# Patient Record
Sex: Male | Born: 1965 | Race: White | Hispanic: No | Marital: Married | Smoking: Never smoker
Health system: Southern US, Community
[De-identification: ages and names within clinical notes are randomized; demographics above are authoritative.]

## PROBLEM LIST (undated history)

## (undated) DIAGNOSIS — E785 Hyperlipidemia, unspecified: Secondary | ICD-10-CM

## (undated) DIAGNOSIS — I10 Essential (primary) hypertension: Secondary | ICD-10-CM

## (undated) DIAGNOSIS — E7800 Pure hypercholesterolemia, unspecified: Secondary | ICD-10-CM

## (undated) DIAGNOSIS — Z87442 Personal history of urinary calculi: Secondary | ICD-10-CM

## (undated) DIAGNOSIS — K429 Umbilical hernia without obstruction or gangrene: Secondary | ICD-10-CM

## (undated) HISTORY — PX: HERNIA REPAIR: SHX51

## (undated) HISTORY — PX: UMBILICAL HERNIA REPAIR: SHX196

---

## 2005-04-05 ENCOUNTER — Emergency Department: Payer: Self-pay | Admitting: Emergency Medicine

## 2005-09-30 ENCOUNTER — Ambulatory Visit: Payer: Self-pay | Admitting: Urology

## 2005-12-15 ENCOUNTER — Ambulatory Visit: Payer: Self-pay | Admitting: Unknown Physician Specialty

## 2009-07-05 ENCOUNTER — Emergency Department: Payer: Self-pay | Admitting: Emergency Medicine

## 2009-07-09 ENCOUNTER — Ambulatory Visit: Payer: Self-pay | Admitting: Internal Medicine

## 2010-07-17 ENCOUNTER — Emergency Department: Payer: Self-pay | Admitting: Emergency Medicine

## 2011-06-18 ENCOUNTER — Ambulatory Visit: Payer: Self-pay | Admitting: Surgery

## 2013-01-15 ENCOUNTER — Ambulatory Visit: Payer: Self-pay | Admitting: Family Medicine

## 2013-03-30 ENCOUNTER — Emergency Department: Payer: Self-pay | Admitting: Unknown Physician Specialty

## 2013-03-30 LAB — COMPREHENSIVE METABOLIC PANEL
Albumin: 4 g/dL (ref 3.4–5.0)
Anion Gap: 8 (ref 7–16)
BUN: 15 mg/dL (ref 7–18)
Bilirubin,Total: 0.3 mg/dL (ref 0.2–1.0)
Calcium, Total: 9 mg/dL (ref 8.5–10.1)
Chloride: 109 mmol/L — ABNORMAL HIGH (ref 98–107)
Co2: 24 mmol/L (ref 21–32)
Glucose: 83 mg/dL (ref 65–99)
Osmolality: 281 (ref 275–301)
Potassium: 3.9 mmol/L (ref 3.5–5.1)
SGOT(AST): 34 U/L (ref 15–37)
SGPT (ALT): 48 U/L (ref 12–78)
Total Protein: 7.2 g/dL (ref 6.4–8.2)

## 2013-03-30 LAB — CBC
HCT: 46.5 % (ref 40.0–52.0)
HGB: 16.2 g/dL (ref 13.0–18.0)
MCH: 29.5 pg (ref 26.0–34.0)
MCHC: 34.9 g/dL (ref 32.0–36.0)
MCV: 85 fL (ref 80–100)
Platelet: 243 10*3/uL (ref 150–440)

## 2013-09-24 IMAGING — CR DG CHEST 2V
1 series · 2 of 2 positions shown · non-contrast
Comparison: none

REASON FOR EXAM: r/o tb/pneumonia   cough
COMMENTS:

PROCEDURE:     MDR - MDR CHEST PA(OR AP) AND LATERAL  - January 15, 2013 [DATE]
RESULT:     Comparison: 07/05/2009

[Series 1: pa · 0.17mm/px · 2 of 2 slices shown]
[im 1/2]
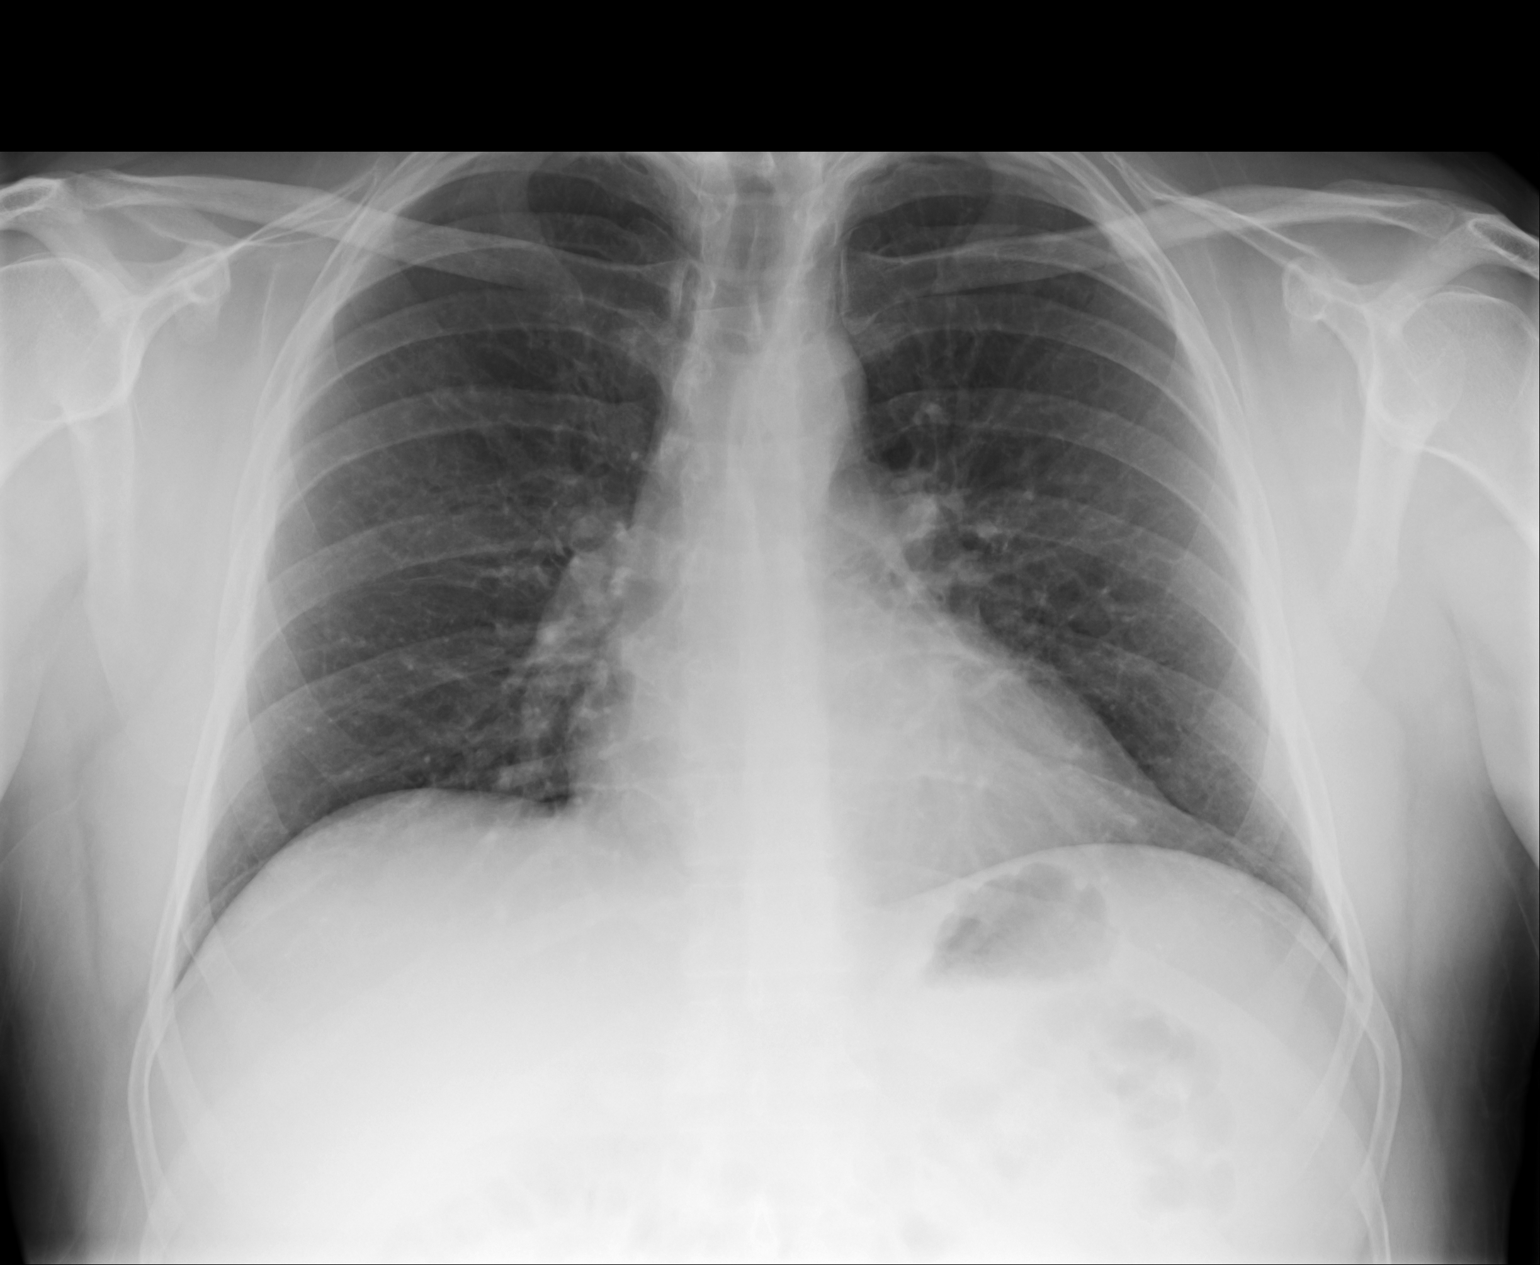
[im 2/2]
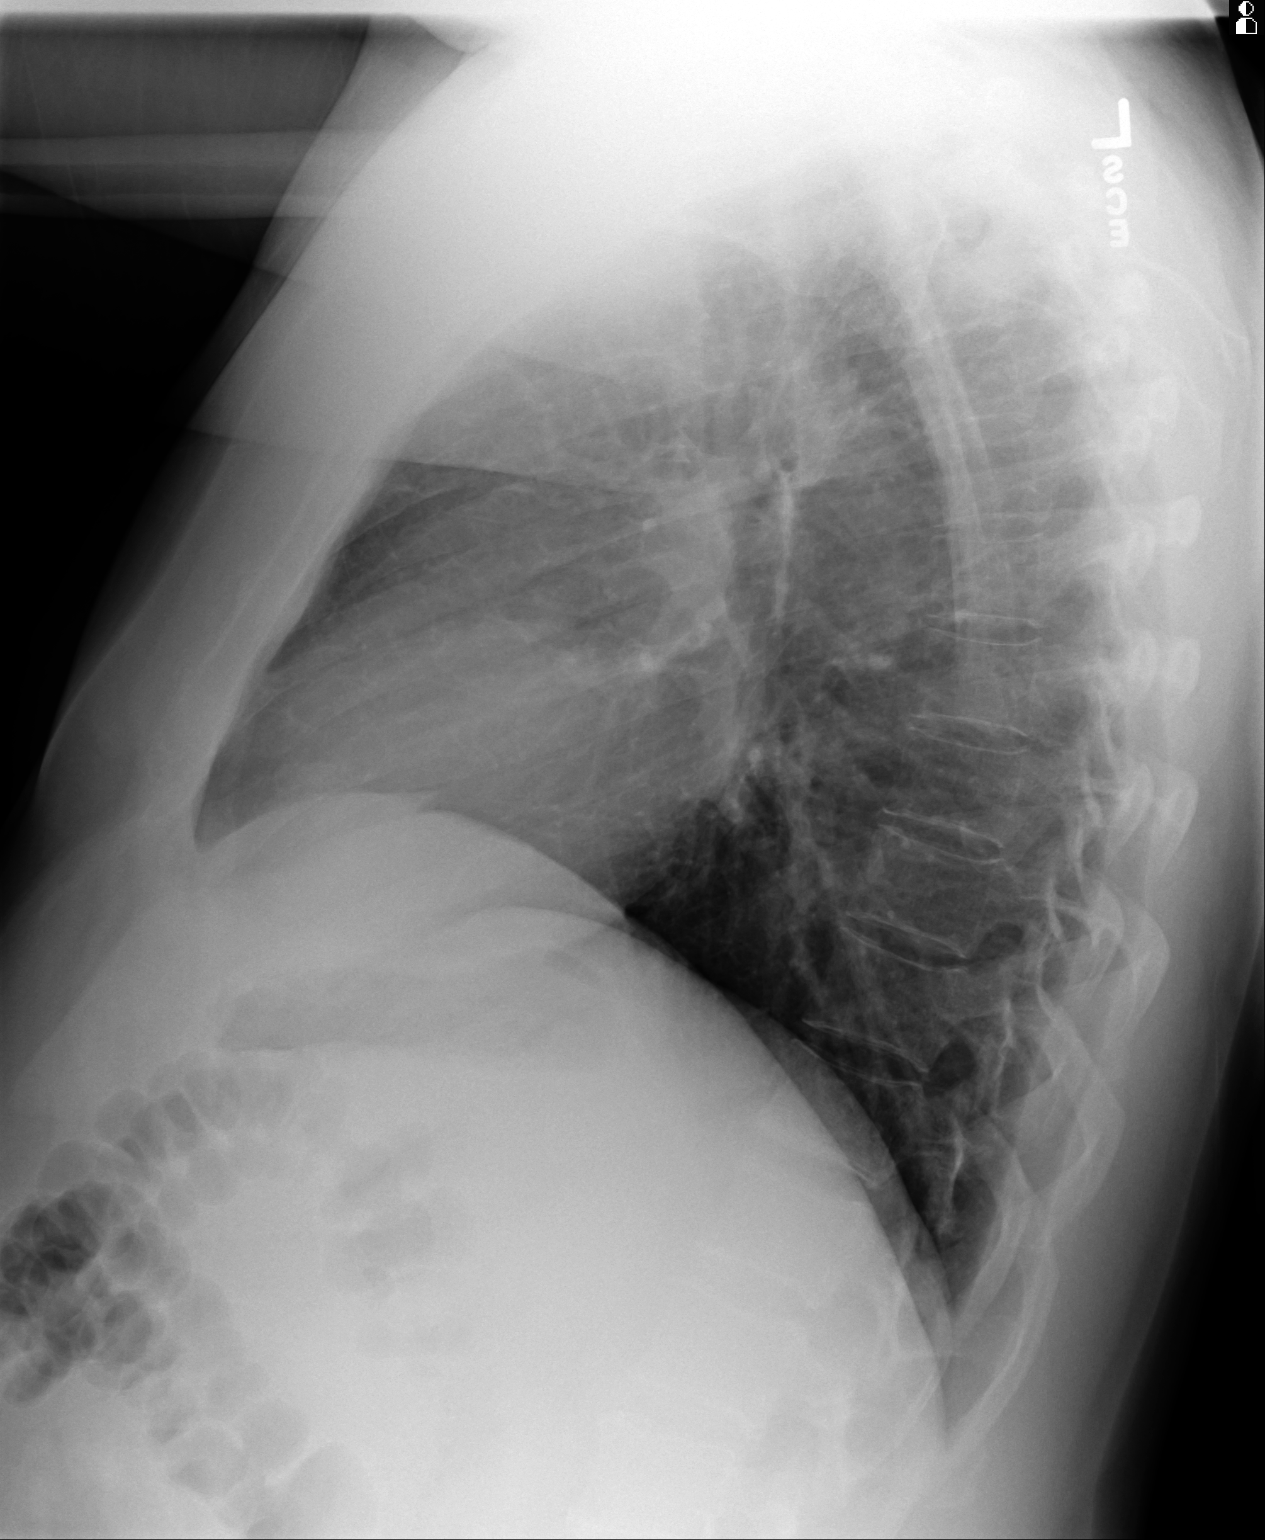

[2 of 2 positions shown; findings below may reference images not displayed]

FINDINGS: The heart and mediastinum are stable. The lung volumes are slightly
diminished. Mild biapical pleural-parenchymal thickening is similar to
prior. No focal pulmonary opacities.
IMPRESSION: No acute cardiopulmonary disease.

[REDACTED]

## 2016-01-11 ENCOUNTER — Emergency Department
Admission: EM | Admit: 2016-01-11 | Discharge: 2016-01-11 | Disposition: A | Discharge status: Home / Self Care | Payer: BC Managed Care – PPO | Attending: Emergency Medicine | Admitting: Emergency Medicine

## 2016-01-11 ENCOUNTER — Encounter: Payer: Self-pay | Admitting: Emergency Medicine

## 2016-01-11 DIAGNOSIS — L509 Urticaria, unspecified: Secondary | ICD-10-CM | POA: Insufficient documentation

## 2016-01-11 DIAGNOSIS — R21 Rash and other nonspecific skin eruption: Secondary | ICD-10-CM | POA: Diagnosis present

## 2016-01-11 HISTORY — DX: Pure hypercholesterolemia, unspecified: E78.00

## 2016-01-11 MED ORDER — PREDNISONE 50 MG PO TABS: 50.0000 mg | ORAL_TABLET | Freq: Every day | ORAL | Status: DC

## 2016-01-11 NOTE — ED Notes (Signed)
Patient continues to be in no acute distress.

## 2016-01-11 NOTE — Discharge Instructions (Signed)
Hives Hives are itchy, red, swollen areas of the skin. They can vary in size and location on your body. Hives can come and go for hours or several days (acute hives) or for several weeks (chronic hives). Hives do not spread from person to person (noncontagious). They may get worse with scratching, exercise, and emotional stress. CAUSES   Allergic reaction to food, additives, or drugs.  Infections, including the common cold.  Illness, such as vasculitis, lupus, or thyroid disease.  Exposure to sunlight, heat, or cold.  Exercise.  Stress.  Contact with chemicals. SYMPTOMS   Red or white swollen patches on the skin. The patches may change size, shape, and location quickly and repeatedly.  Itching.  Swelling of the hands, feet, and face. This may occur if hives develop deeper in the skin. DIAGNOSIS  Your caregiver can usually tell what is wrong by performing a physical exam. Skin or blood tests may also be done to determine the cause of your hives. In some cases, the cause cannot be determined. TREATMENT  Mild cases usually get better with medicines such as antihistamines. Severe cases may require an emergency epinephrine injection. If the cause of your hives is known, treatment includes avoiding that trigger.  HOME CARE INSTRUCTIONS   Avoid causes that trigger your hives.  Take antihistamines as directed by your caregiver to reduce the severity of your hives. Non-sedating or low-sedating antihistamines are usually recommended. Do not drive while taking an antihistamine.  Take any other medicines prescribed for itching as directed by your caregiver.  Wear loose-fitting clothing.  Keep all follow-up appointments as directed by your caregiver. SEEK MEDICAL CARE IF:   You have persistent or severe itching that is not relieved with medicine.  You have painful or swollen joints. SEEK IMMEDIATE MEDICAL CARE IF:   You have a fever.  Your tongue or lips are swollen.  You have  trouble breathing or swallowing.  You feel tightness in the throat or chest.  You have abdominal pain. These problems may be the first sign of a life-threatening allergic reaction. Call your local emergency services (911 in [place] MAKE SURE YOU:   Understand these instructions.  Will watch your condition.  Will get help right away if you are not doing well or get worse.   This information is not intended to replace advice given to you by your health care provider. Make sure you discuss any questions you have with your health care provider.   Document Released: 11/08/2005 Document Revised: 11/13/2013 Document Reviewed: 02/01/2012 Elsevier Interactive Patient Education 2016 [employer]  

## 2016-01-11 NOTE — ED Notes (Signed)
Pt states he started having a rash yesterday on his R side. Pt states that he began taking Benadryl and since then the rash has moved to his hands and arms. Pt states she feels like his throat is tight. Pt breathing with no difficulty at this time, no acute distress noted at this time. Pt able to swallow and maintain secretions at this time.

## 2016-01-11 NOTE — ED Notes (Signed)
Discussed discharge instructions, prescriptions, and follow-up care with patient. No questions or concerns at this time. Pt stable at discharge.  

## 2016-01-11 NOTE — ED Provider Notes (Signed)
[facility] Emergency Department Provider Note  ____________________________________________  Time seen: On arrival  I have reviewed the triage vital signs and the nursing notes.   HISTORY  Chief Complaint Rash and Allergic Reaction    HPI Joel Delacruz is a 50 y.o. male who presents with a rash. Patient reports he developed an itchy rash that appears to be mobile yesterday. It does improve somewhat with Benadryl. He denies shortness of breath to me. He says his throat feels fine. He has never had hives before.    Past Medical History  Diagnosis Date  . High cholesterol     There are no active problems to display for this patient.   Past Surgical History  Procedure Laterality Date  . Hernia repair      Current Outpatient Rx  Name  Route  Sig  Dispense  Refill  . predniSONE (DELTASONE) 50 MG tablet   Oral   Take 1 tablet (50 mg total) by mouth daily with breakfast.   5 tablet   0     Allergies Review of patient's allergies indicates no known allergies.  History reviewed. No pertinent family history.  Social History Social History  Substance Use Topics  . Smoking status: Never Smoker   . Smokeless tobacco: None  . Alcohol Use: Yes     Comment: Occassionally    Review of Systems  Constitutional: Negative for fever. Eyes: Negative for visual changes. ENT: Negative for sore throat   Genitourinary: Negative for dysuria. Musculoskeletal: Negative for back pain. Skin: Negative for rash. Neurological: Negative for headaches or focal weakness   ____________________________________________   PHYSICAL EXAM:  VITAL SIGNS: ED Triage Vitals  Enc Vitals Group     BP 01/11/16 1335 165/106 mmHg     Pulse Rate 01/11/16 1335 90     Resp 01/11/16 1335 18     Temp 01/11/16 1335 98 F (36.7 C)     Temp Source 01/11/16 1335 Oral     SpO2 01/11/16 1335 95 %     Weight 01/11/16 1335 220 lb (99.791 kg)     Height 01/11/16 1335   (1.727 m)     Head Cir --      Peak Flow --      Pain Score --      Pain Loc --      Pain Edu? --      Excl. in GC? --      Constitutional: Alert and oriented. Well appearing and in no distress. Eyes: Conjunctivae are normal.  ENT   Head: Normocephalic and atraumatic.   Mouth/Throat: Mucous membranes are moist. Cardiovascular: Normal rate, regular rhythm.  Respiratory: Normal respiratory effort without tachypnea nor retractions.  Gastrointestinal: Soft and non-tender in all quadrants. No distention. There is no CVA tenderness. Musculoskeletal: Nontender with normal range of motion in all extremities. Neurologic:  Normal speech and language. No gross focal neurologic deficits are appreciated. Skin:  Skin is warm, dry and intact. Rash consistent with urticaria on the trunk and arms Psychiatric: Mood and affect are normal. Patient exhibits appropriate insight and judgment.  ____________________________________________    LABS (pertinent positives/negatives)  Labs Reviewed - No data to display  ____________________________________________     ____________________________________________    RADIOLOGY I have personally reviewed any xrays that were ordered on this patient: None  ____________________________________________   PROCEDURES  Procedure(s) performed: none   ____________________________________________   INITIAL IMPRESSION / ASSESSMENT AND PLAN / ED COURSE  Pertinent labs &  imaging results that were available during my care of the patient were reviewed by me and considered in my medical decision making (see chart for details).  Patient has been taking Benadryl which has been helping but the rash has returned. We will start steroids for suspected urticaria. No intraoral swelling or pharyngeal swelling. No shortness of breath. No stridor. Patient is well-appearing and comfortable. Return precautions  discussed  ____________________________________________   FINAL CLINICAL IMPRESSION(S) / ED DIAGNOSES  Final diagnoses:  Hives     Jene Every, MD 01/11/16 2239

## 2020-07-30 ENCOUNTER — Other Ambulatory Visit: Payer: Self-pay

## 2020-07-30 ENCOUNTER — Other Ambulatory Visit: Payer: BC Managed Care – PPO

## 2020-07-30 DIAGNOSIS — Z20822 Contact with and (suspected) exposure to covid-19: Secondary | ICD-10-CM

## 2020-08-01 LAB — SARS-COV-2, NAA 2 DAY TAT

## 2020-08-01 LAB — NOVEL CORONAVIRUS, NAA: SARS-CoV-2, NAA: NOT DETECTED

## 2020-08-04 ENCOUNTER — Other Ambulatory Visit: Payer: Self-pay | Admitting: Critical Care Medicine

## 2020-08-04 ENCOUNTER — Other Ambulatory Visit: Payer: BC Managed Care – PPO

## 2020-08-04 DIAGNOSIS — Z20822 Contact with and (suspected) exposure to covid-19: Secondary | ICD-10-CM

## 2020-08-05 LAB — NOVEL CORONAVIRUS, NAA: SARS-CoV-2, NAA: NOT DETECTED

## 2020-08-05 LAB — SARS-COV-2, NAA 2 DAY TAT

## 2020-08-06 ENCOUNTER — Other Ambulatory Visit: Payer: BC Managed Care – PPO

## 2020-08-13 ENCOUNTER — Other Ambulatory Visit: Payer: BC Managed Care – PPO

## 2020-08-13 DIAGNOSIS — Z20822 Contact with and (suspected) exposure to covid-19: Secondary | ICD-10-CM

## 2020-08-16 LAB — NOVEL CORONAVIRUS, NAA: SARS-CoV-2, NAA: NOT DETECTED

## 2020-08-18 ENCOUNTER — Other Ambulatory Visit: Payer: Self-pay

## 2020-08-18 ENCOUNTER — Other Ambulatory Visit: Payer: Self-pay | Admitting: Specialist

## 2020-08-18 ENCOUNTER — Ambulatory Visit
Admission: RE | Admit: 2020-08-18 | Discharge: 2020-08-18 | Disposition: A | Discharge status: Home / Self Care | Payer: BC Managed Care – PPO | Source: Ambulatory Visit | Attending: Specialist | Admitting: Specialist

## 2020-08-18 ENCOUNTER — Ambulatory Visit
Admission: RE | Admit: 2020-08-18 | Discharge: 2020-08-18 | Disposition: A | Discharge status: Home / Self Care | Payer: BC Managed Care – PPO | Attending: Specialist | Admitting: Specialist

## 2020-08-18 DIAGNOSIS — R059 Cough, unspecified: Secondary | ICD-10-CM

## 2020-08-18 DIAGNOSIS — R05 Cough: Secondary | ICD-10-CM | POA: Diagnosis not present

## 2020-12-01 ENCOUNTER — Other Ambulatory Visit: Payer: Self-pay

## 2020-12-01 DIAGNOSIS — Z20822 Contact with and (suspected) exposure to covid-19: Secondary | ICD-10-CM

## 2020-12-02 LAB — SARS-COV-2, NAA 2 DAY TAT

## 2020-12-02 LAB — NOVEL CORONAVIRUS, NAA: SARS-CoV-2, NAA: NOT DETECTED

## 2020-12-15 ENCOUNTER — Other Ambulatory Visit: Payer: Self-pay

## 2020-12-15 DIAGNOSIS — Z20822 Contact with and (suspected) exposure to covid-19: Secondary | ICD-10-CM

## 2020-12-16 LAB — NOVEL CORONAVIRUS, NAA: SARS-CoV-2, NAA: NOT DETECTED

## 2020-12-16 LAB — SARS-COV-2, NAA 2 DAY TAT

## 2020-12-22 ENCOUNTER — Other Ambulatory Visit: Payer: Self-pay

## 2020-12-22 DIAGNOSIS — Z20822 Contact with and (suspected) exposure to covid-19: Secondary | ICD-10-CM

## 2020-12-23 LAB — NOVEL CORONAVIRUS, NAA: SARS-CoV-2, NAA: NOT DETECTED

## 2020-12-23 LAB — SARS-COV-2, NAA 2 DAY TAT

## 2020-12-29 ENCOUNTER — Other Ambulatory Visit: Payer: Self-pay

## 2021-04-27 IMAGING — CR DG CHEST 2V
2 series · 2 of 2 positions shown · non-contrast
Comparison: January 15, 2013.

CLINICAL DATA: Cough with fever and shortness of breath

EXAM:
CHEST - 2 VIEW

[chest pa]
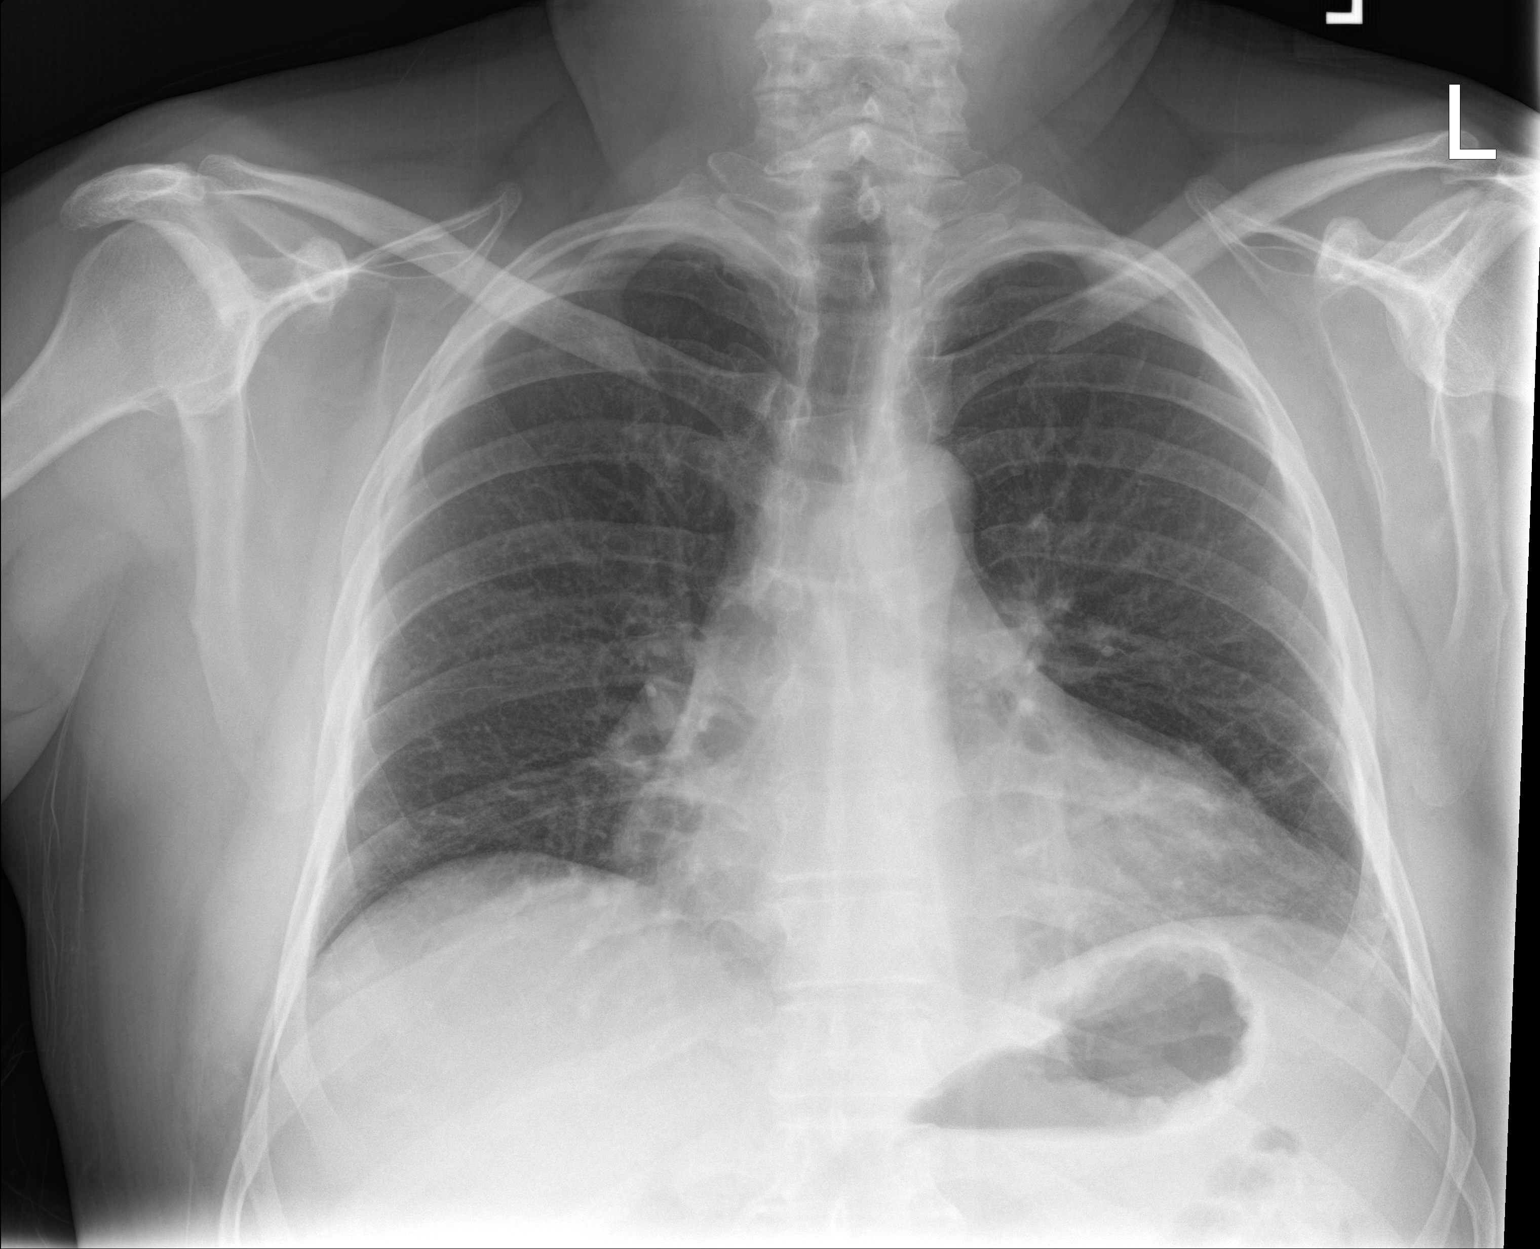

[chest lat]
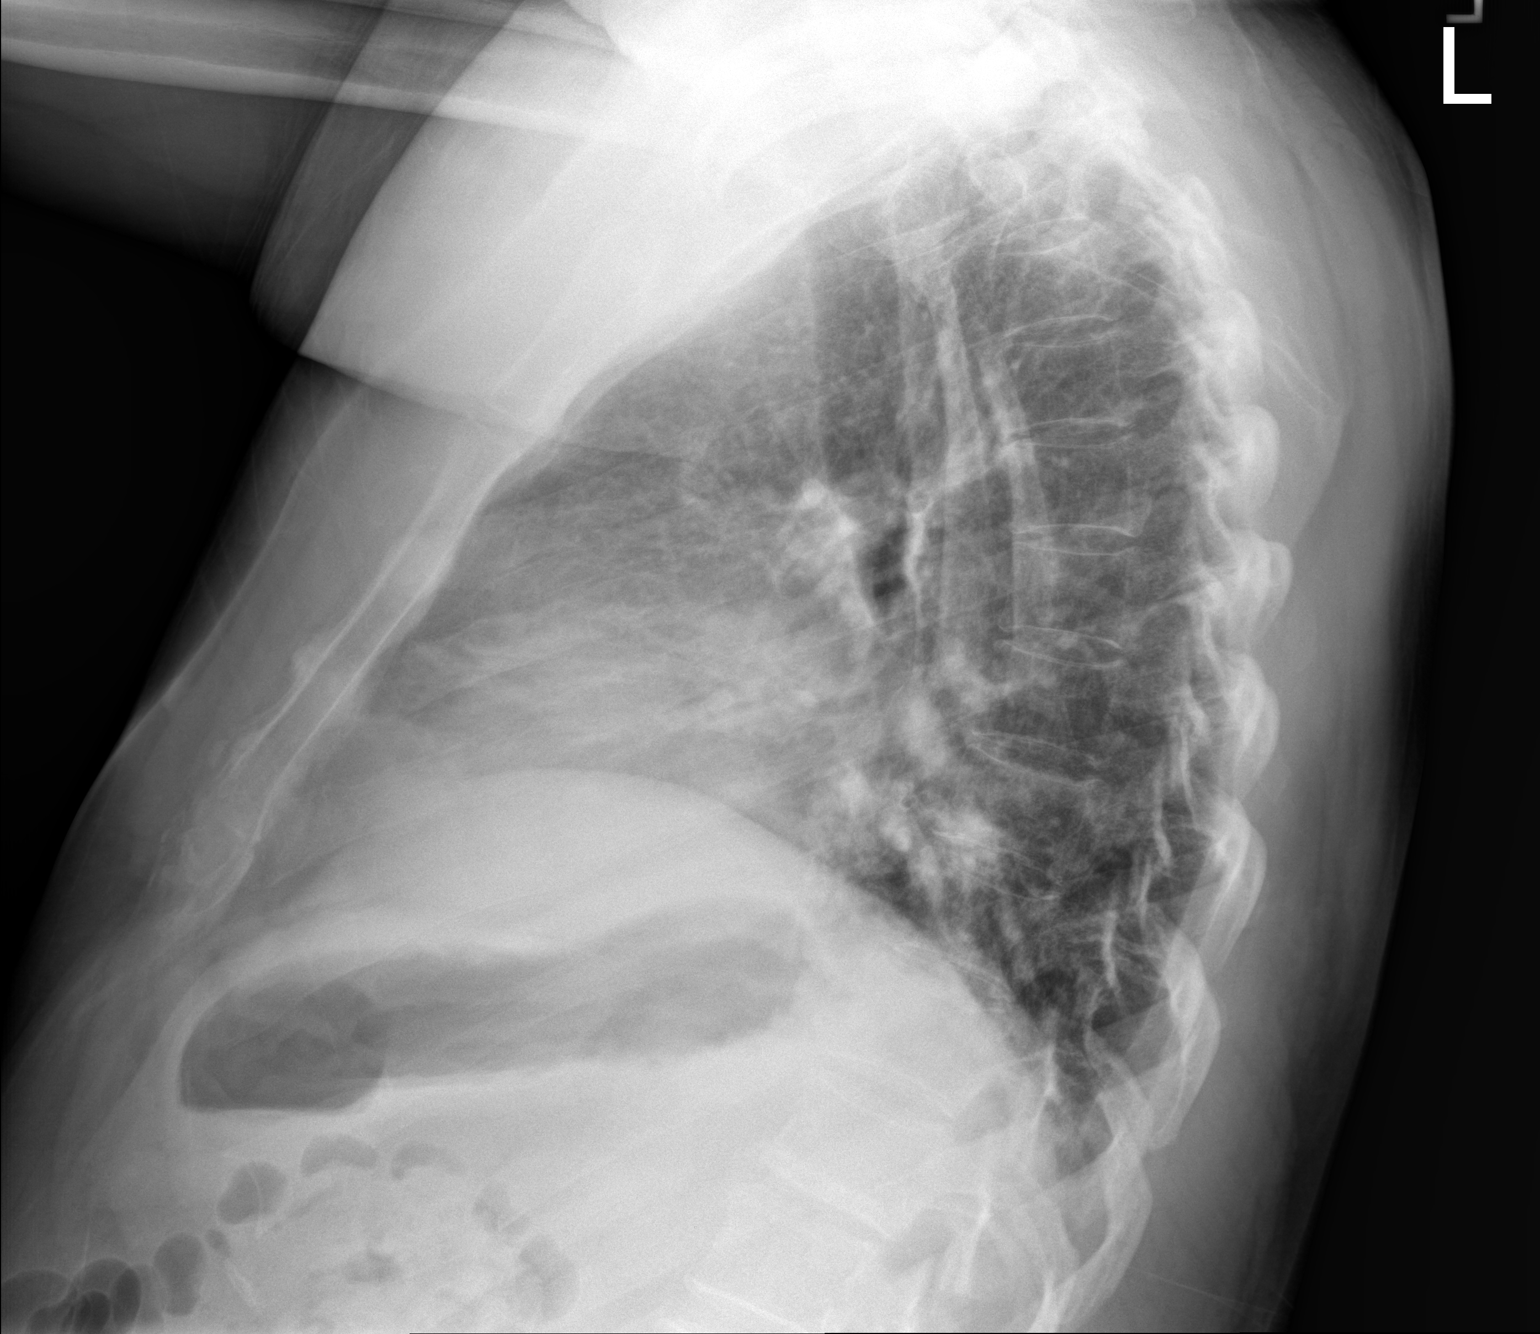

[2 of 2 positions shown; findings below may reference images not displayed]

FINDINGS: There is slight atelectasis in the left base. The lungs elsewhere
are clear. Heart is upper normal in size with pulmonary vascularity
normal. No adenopathy. No bone lesions.
IMPRESSION: Slight left base atelectasis. Lungs elsewhere clear. Stable cardiac
silhouette. No adenopathy appreciable.

## 2021-08-31 HISTORY — PX: COLONOSCOPY: SHX174

## 2022-08-24 ENCOUNTER — Encounter
Admission: RE | Admit: 2022-08-24 | Discharge: 2022-08-24 | Disposition: A | Discharge status: Home / Self Care | Payer: BC Managed Care – PPO | Source: Ambulatory Visit | Attending: Orthopedic Surgery | Admitting: Orthopedic Surgery

## 2022-08-24 ENCOUNTER — Other Ambulatory Visit: Payer: Self-pay | Admitting: Orthopedic Surgery

## 2022-08-24 VITALS — Ht 68.0 in | Wt 225.0 lb

## 2022-08-24 DIAGNOSIS — Z01812 Encounter for preprocedural laboratory examination: Secondary | ICD-10-CM

## 2022-08-24 DIAGNOSIS — I10 Essential (primary) hypertension: Secondary | ICD-10-CM

## 2022-08-24 HISTORY — DX: Essential (primary) hypertension: I10

## 2022-08-24 HISTORY — DX: Hyperlipidemia, unspecified: E78.5

## 2022-08-24 HISTORY — DX: Personal history of urinary calculi: Z87.442

## 2022-08-24 HISTORY — DX: Umbilical hernia without obstruction or gangrene: K42.9

## 2022-08-24 NOTE — Patient Instructions (Addendum)
Your procedure is scheduled on: Tuesday, October 10 Report to the Registration Desk on the 1st floor of the [facility] To find out your arrival time, please call [phone] between 1PM - 3PM on: Monday, October 9 If your arrival time is 6:00 am, do not arrive prior to that time as the [facility] entrance doors do not open until 6:00 am.  REMEMBER: Instructions that are not followed completely may result in serious medical risk, up to and including death; or upon the discretion of your surgeon and anesthesiologist your surgery may need to be rescheduled.  Do not eat food after midnight the night before surgery.  No gum chewing, lozengers or hard candies.  You may however, drink CLEAR liquids up to 2 hours before you are scheduled to arrive for your surgery. Do not drink anything within 2 hours of your scheduled arrival time.  Clear liquids include: - water  - apple juice without pulp - gatorade (not RED colors) - black coffee or tea (Do NOT add milk or creamers to the coffee or tea) Do NOT drink anything that is not on this list.  TAKE THESE MEDICATIONS THE MORNING OF SURGERY WITH A SIP OF WATER:  Diltiazem  One week prior to surgery: starting October 3 Stop Anti-inflammatories (NSAIDS) such as Advil, Aleve, Ibuprofen, Motrin, Naproxen, Naprosyn and Aspirin based products such as Excedrin, Goodys Powder, BC Powder. Stop ANY OVER THE COUNTER supplements until after surgery. Stop melatonin. You may however, continue to take Tylenol if needed for pain up until the day of surgery.  No Alcohol for 24 hours before or after surgery.  No Smoking including e-cigarettes for 24 hours prior to surgery.  No chewable tobacco products for at least 6 hours prior to surgery.  No nicotine patches on the day of surgery.  Do not use any "recreational" drugs for at least a week prior to your surgery.  Please be advised that the combination of cocaine and anesthesia may have negative outcomes,  up to and including death. If you test positive for cocaine, your surgery will be cancelled.  On the morning of surgery brush your teeth with toothpaste and water, you may rinse your mouth with mouthwash if you wish. Do not swallow any toothpaste or mouthwash.  Use CHG Soap as directed on instruction sheet.  Do not wear jewelry.  Do not wear lotions, powders, or perfumes.   Do not shave body from the neck down 48 hours prior to surgery just in case you cut yourself which could leave a site for infection.  Also, freshly shaved skin may become irritated if using the CHG soap.  Do not bring valuables to the hospital. [facility] is not responsible for any missing/lost belongings or valuables.   Notify your doctor if there is any change in your medical condition (cold, fever, infection).  Wear comfortable clothing (specific to your surgery type) to the hospital.  After surgery, you can help prevent lung complications by doing breathing exercises.  Take deep breaths and cough every 1-2 hours. Your doctor may order a device called an Incentive Spirometer to help you take deep breaths.  If you are being discharged the day of surgery, you will not be allowed to drive home. You will need a responsible adult (18 years or older) to drive you home and stay with you that night.   If you are taking public transportation, you will need to have a responsible adult (18 years or older) with you. Please confirm  with your physician that it is acceptable to use public transportation.   Please call the [facility] at [phone] if you have any questions about these instructions.  Surgery Visitation Policy:  Patients undergoing a surgery or procedure may have two family members or support persons with them as long as the person is not COVID-19 positive or experiencing its symptoms.      Preparing for Surgery with CHLORHEXIDINE GLUCONATE (CHG) Soap  Chlorhexidine Gluconate (CHG)  Soap  o An antiseptic cleaner that kills germs and bonds with the skin to continue killing germs even after washing  o Used for showering the night before surgery and morning of surgery  Before surgery, you can play an important role by reducing the number of germs on your skin.  CHG (Chlorhexidine gluconate) soap is an antiseptic cleanser which kills germs and bonds with the skin to continue killing germs even after washing.  Please do not use if you have an allergy to CHG or antibacterial soaps. If your skin becomes reddened/irritated stop using the CHG.  1. Shower the NIGHT BEFORE SURGERY and the MORNING OF SURGERY with CHG soap.  2. If you choose to wash your hair, wash your hair first as usual with your normal shampoo.  3. After shampooing, rinse your hair and body thoroughly to remove the shampoo.  4. Use CHG as you would any other liquid soap. You can apply CHG directly to the skin and wash gently with a scrungie or a clean washcloth.  5. Apply the CHG soap to your body only from the neck down. Do not use on open wounds or open sores. Avoid contact with your eyes, ears, mouth, and genitals (private parts). Wash face and genitals (private parts) with your normal soap.  6. Wash thoroughly, paying special attention to the area where your surgery will be performed.  7. Thoroughly rinse your body with warm water.  8. Do not shower/wash with your normal soap after using and rinsing off the CHG soap.  9. Pat yourself dry with a clean towel.  10. Wear clean pajamas to bed the night before surgery.  12. Place clean sheets on your bed the night of your first shower and do not sleep with pets.  13. Shower again with the CHG soap on the day of surgery prior to arriving at the hospital.  14. Do not apply any deodorants/lotions/powders.  15. Please wear clean clothes to the hospital.

## 2022-08-25 ENCOUNTER — Encounter
Admission: RE | Admit: 2022-08-25 | Discharge: 2022-08-25 | Disposition: A | Discharge status: Home / Self Care | Payer: BC Managed Care – PPO | Source: Ambulatory Visit | Attending: Orthopedic Surgery | Admitting: Orthopedic Surgery

## 2022-08-25 ENCOUNTER — Encounter: Payer: Self-pay | Admitting: Urgent Care

## 2022-08-25 DIAGNOSIS — Z01812 Encounter for preprocedural laboratory examination: Secondary | ICD-10-CM

## 2022-08-25 DIAGNOSIS — Z01818 Encounter for other preprocedural examination: Secondary | ICD-10-CM | POA: Diagnosis present

## 2022-08-25 DIAGNOSIS — I10 Essential (primary) hypertension: Secondary | ICD-10-CM | POA: Diagnosis not present

## 2022-08-25 LAB — CBC
HCT: 42.5 % (ref 39.0–52.0)
Hemoglobin: 14.3 g/dL (ref 13.0–17.0)
MCH: 29.2 pg (ref 26.0–34.0)
MCHC: 33.6 g/dL (ref 30.0–36.0)
MCV: 86.7 fL (ref 80.0–100.0)
Platelets: 256 10*3/uL (ref 150–400)
RBC: 4.9 MIL/uL (ref 4.22–5.81)
RDW: 13.1 % (ref 11.5–15.5)
WBC: 7.3 10*3/uL (ref 4.0–10.5)
nRBC: 0 % (ref 0.0–0.2)

## 2022-08-25 LAB — BASIC METABOLIC PANEL
Anion gap: 8 (ref 5–15)
BUN: 16 mg/dL (ref 6–20)
CO2: 24 mmol/L (ref 22–32)
Calcium: 9.3 mg/dL (ref 8.9–10.3)
Chloride: 110 mmol/L (ref 98–111)
Creatinine, Ser: 1.05 mg/dL (ref 0.61–1.24)
GFR, Estimated: >60 mL/min (ref >60)
Glucose, Bld: 100 mg/dL — ABNORMAL HIGH (ref 70–99)
Potassium: 3.9 mmol/L (ref 3.5–5.1)
Sodium: 142 mmol/L (ref 135–145)

## 2022-08-31 ENCOUNTER — Encounter
Admission: RE | Disposition: A | Discharge status: Home / Self Care | Payer: Self-pay | Source: Home / Self Care | Attending: Orthopedic Surgery

## 2022-08-31 ENCOUNTER — Ambulatory Visit
Admission: RE | Admit: 2022-08-31 | Discharge: 2022-08-31 | Disposition: A | Discharge status: Home / Self Care | Payer: BC Managed Care – PPO | Attending: Orthopedic Surgery | Admitting: Orthopedic Surgery

## 2022-08-31 ENCOUNTER — Other Ambulatory Visit: Payer: Self-pay

## 2022-08-31 ENCOUNTER — Ambulatory Visit: Payer: BC Managed Care – PPO

## 2022-08-31 ENCOUNTER — Ambulatory Visit: Payer: BC Managed Care – PPO | Admitting: Urgent Care

## 2022-08-31 ENCOUNTER — Encounter: Payer: Self-pay | Admitting: Orthopedic Surgery

## 2022-08-31 DIAGNOSIS — E7800 Pure hypercholesterolemia, unspecified: Secondary | ICD-10-CM | POA: Insufficient documentation

## 2022-08-31 DIAGNOSIS — X58XXXA Exposure to other specified factors, initial encounter: Secondary | ICD-10-CM | POA: Diagnosis not present

## 2022-08-31 DIAGNOSIS — S46012A Strain of muscle(s) and tendon(s) of the rotator cuff of left shoulder, initial encounter: Secondary | ICD-10-CM | POA: Insufficient documentation

## 2022-08-31 DIAGNOSIS — I10 Essential (primary) hypertension: Secondary | ICD-10-CM | POA: Diagnosis not present

## 2022-08-31 DIAGNOSIS — E785 Hyperlipidemia, unspecified: Secondary | ICD-10-CM | POA: Insufficient documentation

## 2022-08-31 DIAGNOSIS — S43432A Superior glenoid labrum lesion of left shoulder, initial encounter: Secondary | ICD-10-CM | POA: Insufficient documentation

## 2022-08-31 HISTORY — PX: SHOULDER ARTHROSCOPY WITH OPEN ROTATOR CUFF REPAIR AND DISTAL CLAVICLE ACROMINECTOMY: SHX5683

## 2022-08-31 SURGERY — SHOULDER ARTHROSCOPY WITH OPEN ROTATOR CUFF REPAIR AND DISTAL CLAVICLE ACROMINECTOMY
Anesthesia: General | Laterality: Left

## 2022-08-31 MED ORDER — ACETAMINOPHEN 500 MG PO TABS: 1000.0000 mg | ORAL_TABLET | ORAL | Status: AC

## 2022-08-31 MED ORDER — OXYCODONE HCL 5 MG PO TABS: 5.0000 mg | ORAL_TABLET | ORAL | 0 refills | Status: AC | PRN

## 2022-08-31 MED ORDER — MIDAZOLAM HCL 2 MG/2ML IJ SOLN
INTRAMUSCULAR | Status: DC | PRN
  Administered 2022-08-31 (×2): 2 mg via INTRAVENOUS

## 2022-08-31 MED ORDER — PHENYLEPHRINE HCL-NACL 20-0.9 MG/250ML-% IV SOLN
INTRAVENOUS | Status: AC
  Filled 2022-08-31: qty 250

## 2022-08-31 MED ORDER — ACETAMINOPHEN 500 MG PO TABS
ORAL_TABLET | ORAL | Status: AC
  Administered 2022-08-31: 1000 mg via ORAL
  Filled 2022-08-31: qty 2

## 2022-08-31 MED ORDER — FENTANYL CITRATE PF 50 MCG/ML IJ SOSY: 50.0000 ug | PREFILLED_SYRINGE | Freq: Once | INTRAMUSCULAR | Status: AC

## 2022-08-31 MED ORDER — EPINEPHRINE PF 1 MG/ML IJ SOLN
INTRAMUSCULAR | Status: DC | PRN
  Administered 2022-08-31: 4 mL

## 2022-08-31 MED ORDER — FENTANYL CITRATE (PF) 100 MCG/2ML IJ SOLN: 25.0000 ug | INTRAMUSCULAR | Status: DC | PRN

## 2022-08-31 MED ORDER — DEXAMETHASONE SODIUM PHOSPHATE 10 MG/ML IJ SOLN
INTRAMUSCULAR | Status: DC | PRN
  Administered 2022-08-31: 10 mg via INTRAVENOUS

## 2022-08-31 MED ORDER — CHLORHEXIDINE GLUCONATE CLOTH 2 % EX PADS
6.0000 | MEDICATED_PAD | Freq: Once | CUTANEOUS | Status: AC
  Administered 2022-08-31: 6 via TOPICAL

## 2022-08-31 MED ORDER — BUPIVACAINE HCL (PF) 0.5 % IJ SOLN
INTRAMUSCULAR | Status: AC
  Filled 2022-08-31: qty 10

## 2022-08-31 MED ORDER — MIDAZOLAM HCL 2 MG/2ML IJ SOLN
INTRAMUSCULAR | Status: AC
  Filled 2022-08-31: qty 2

## 2022-08-31 MED ORDER — CHLORHEXIDINE GLUCONATE 0.12 % MT SOLN: 15.0000 mL | Freq: Once | OROMUCOSAL | Status: AC

## 2022-08-31 MED ORDER — BUPIVACAINE LIPOSOME 1.3 % IJ SUSP
INTRAMUSCULAR | Status: DC | PRN
  Administered 2022-08-31: 20 mL

## 2022-08-31 MED ORDER — ORAL CARE MOUTH RINSE: 15.0000 mL | Freq: Once | OROMUCOSAL | Status: AC

## 2022-08-31 MED ORDER — NEOMYCIN-POLYMYXIN B GU 40-200000 IR SOLN
Status: DC | PRN
  Administered 2022-08-31: 2 mL

## 2022-08-31 MED ORDER — DOCUSATE SODIUM 100 MG PO CAPS: 100.0000 mg | ORAL_CAPSULE | Freq: Every day | ORAL | 2 refills | Status: AC | PRN

## 2022-08-31 MED ORDER — 0.9 % SODIUM CHLORIDE (POUR BTL) OPTIME
TOPICAL | Status: DC | PRN
  Administered 2022-08-31: 500 mL

## 2022-08-31 MED ORDER — PHENYLEPHRINE HCL-NACL 20-0.9 MG/250ML-% IV SOLN
INTRAVENOUS | Status: DC | PRN
  Administered 2022-08-31: 50 ug/min via INTRAVENOUS

## 2022-08-31 MED ORDER — CEFAZOLIN SODIUM-DEXTROSE 2-4 GM/100ML-% IV SOLN
INTRAVENOUS | Status: AC
  Filled 2022-08-31: qty 100

## 2022-08-31 MED ORDER — ROCURONIUM BROMIDE 100 MG/10ML IV SOLN
INTRAVENOUS | Status: DC | PRN
  Administered 2022-08-31: 50 mg via INTRAVENOUS
  Administered 2022-08-31: 10 mg via INTRAVENOUS
  Administered 2022-08-31: 20 mg via INTRAVENOUS

## 2022-08-31 MED ORDER — PROPOFOL 10 MG/ML IV BOLUS
INTRAVENOUS | Status: DC | PRN
  Administered 2022-08-31: 150 mg via INTRAVENOUS

## 2022-08-31 MED ORDER — CHLORHEXIDINE GLUCONATE CLOTH 2 % EX PADS: 6.0000 | MEDICATED_PAD | Freq: Once | CUTANEOUS | Status: DC

## 2022-08-31 MED ORDER — OXYCODONE HCL 5 MG/5ML PO SOLN: 5.0000 mg | Freq: Once | ORAL | Status: DC | PRN

## 2022-08-31 MED ORDER — BUPIVACAINE HCL (PF) 0.5 % IJ SOLN
INTRAMUSCULAR | Status: DC | PRN
  Administered 2022-08-31: 10 mL

## 2022-08-31 MED ORDER — FAMOTIDINE 20 MG PO TABS
ORAL_TABLET | ORAL | Status: AC
  Administered 2022-08-31: 20 mg via ORAL
  Filled 2022-08-31: qty 1

## 2022-08-31 MED ORDER — EPINEPHRINE PF 1 MG/ML IJ SOLN
INTRAMUSCULAR | Status: AC
  Filled 2022-08-31: qty 4

## 2022-08-31 MED ORDER — LACTATED RINGERS IV SOLN: INTRAVENOUS | Status: DC

## 2022-08-31 MED ORDER — LIDOCAINE HCL (CARDIAC) PF 100 MG/5ML IV SOSY
PREFILLED_SYRINGE | INTRAVENOUS | Status: DC | PRN
  Administered 2022-08-31: 40 mg via INTRAVENOUS

## 2022-08-31 MED ORDER — FENTANYL CITRATE PF 50 MCG/ML IJ SOSY
PREFILLED_SYRINGE | INTRAMUSCULAR | Status: AC
  Administered 2022-08-31: 50 ug via INTRAVENOUS
  Filled 2022-08-31: qty 1

## 2022-08-31 MED ORDER — ONDANSETRON HCL 4 MG/2ML IJ SOLN
INTRAMUSCULAR | Status: DC | PRN
  Administered 2022-08-31: 4 mg via INTRAVENOUS

## 2022-08-31 MED ORDER — OXYCODONE HCL 5 MG PO TABS: 5.0000 mg | ORAL_TABLET | Freq: Once | ORAL | Status: DC | PRN

## 2022-08-31 MED ORDER — FENTANYL CITRATE (PF) 100 MCG/2ML IJ SOLN
INTRAMUSCULAR | Status: AC
  Filled 2022-08-31: qty 2

## 2022-08-31 MED ORDER — NEOMYCIN-POLYMYXIN B GU 40-200000 IR SOLN
Status: AC
  Filled 2022-08-31: qty 20

## 2022-08-31 MED ORDER — FAMOTIDINE 20 MG PO TABS: 20.0000 mg | ORAL_TABLET | Freq: Once | ORAL | Status: AC

## 2022-08-31 MED ORDER — BUPIVACAINE LIPOSOME 1.3 % IJ SUSP
INTRAMUSCULAR | Status: AC
  Filled 2022-08-31: qty 20

## 2022-08-31 MED ORDER — SUGAMMADEX SODIUM 200 MG/2ML IV SOLN
INTRAVENOUS | Status: DC | PRN
  Administered 2022-08-31: 200 mg via INTRAVENOUS

## 2022-08-31 MED ORDER — CHLORHEXIDINE GLUCONATE 0.12 % MT SOLN
OROMUCOSAL | Status: AC
  Administered 2022-08-31: 15 mL via OROMUCOSAL
  Filled 2022-08-31: qty 15

## 2022-08-31 MED ORDER — RINGERS IRRIGATION IR SOLN
Status: DC | PRN
  Administered 2022-08-31 (×2): 6000 mL
  Administered 2022-08-31: 12000 mL

## 2022-08-31 MED ORDER — CEFAZOLIN SODIUM-DEXTROSE 2-4 GM/100ML-% IV SOLN
2.0000 g | INTRAVENOUS | Status: AC
  Administered 2022-08-31: 2 g via INTRAVENOUS

## 2022-08-31 MED ORDER — ONDANSETRON HCL 4 MG PO TABS: 4.0000 mg | ORAL_TABLET | Freq: Three times a day (TID) | ORAL | 0 refills | Status: AC | PRN

## 2022-08-31 MED ORDER — KETOROLAC TROMETHAMINE 30 MG/ML IJ SOLN
INTRAMUSCULAR | Status: DC | PRN
  Administered 2022-08-31: 30 mg via INTRAVENOUS

## 2022-08-31 MED ORDER — PROPOFOL 10 MG/ML IV BOLUS
INTRAVENOUS | Status: AC
  Filled 2022-08-31: qty 20

## 2022-08-31 MED ORDER — FENTANYL CITRATE (PF) 100 MCG/2ML IJ SOLN
INTRAMUSCULAR | Status: DC | PRN
  Administered 2022-08-31 (×2): 50 ug via INTRAVENOUS

## 2022-08-31 MED ORDER — PHENYLEPHRINE HCL (PRESSORS) 10 MG/ML IV SOLN
INTRAVENOUS | Status: DC | PRN
  Administered 2022-08-31 (×4): 80 ug via INTRAVENOUS

## 2022-08-31 SURGICAL SUPPLY — 68 items
ADAPTER IRRIG TUBE 2 SPIKE SOL (ADAPTER) ×2 IMPLANT
ANCHOR ALL-SUT Q-FIX 2.8 (Anchor) ×4 IMPLANT
ANCHOR SUT 5.5 MULTIFIX (Orthopedic Implant) IMPLANT
ANCHOR SUT BIOC ST 3X145 (Anchor) IMPLANT
BLADE SHAVER 4.5X7 STR FR (MISCELLANEOUS) IMPLANT
CANNULA 5.75X7 CRYSTAL CLEAR (CANNULA) ×2 IMPLANT
CANNULA PARTIAL THREAD 2X7 (CANNULA) ×1 IMPLANT
CANNULA TWIST IN 8.25X9CM (CANNULA) ×2 IMPLANT
CONNECTOR PERFECT PASSER (CONNECTOR) ×2 IMPLANT
COOLER POLAR GLACIER W/PUMP (MISCELLANEOUS) ×1 IMPLANT
DEVICE SUCT BLK HOLE OR FLOOR (MISCELLANEOUS) ×2 IMPLANT
DRAPE 3/4 80X56 (DRAPES) ×1 IMPLANT
DRAPE INCISE IOBAN 66X45 STRL (DRAPES) ×1 IMPLANT
DRAPE U-SHAPE 47X51 STRL (DRAPES) ×1 IMPLANT
DURAPREP 26ML APPLICATOR (WOUND CARE) ×3 IMPLANT
ELECT REM PT RETURN 9FT ADLT (ELECTROSURGICAL) ×1
ELECTRODE REM PT RTRN 9FT ADLT (ELECTROSURGICAL) ×1 IMPLANT
GAUZE SPONGE 4X4 12PLY STRL (GAUZE/BANDAGES/DRESSINGS) ×1 IMPLANT
GAUZE XEROFORM 1X8 LF (GAUZE/BANDAGES/DRESSINGS) ×1 IMPLANT
GLOVE BIOGEL PI IND STRL 9 (GLOVE) ×1 IMPLANT
GLOVE BIOGEL PI ORTHO SZ9 (GLOVE) ×6 IMPLANT
GOWN STRL REUS TWL 2XL XL LVL4 (GOWN DISPOSABLE) ×1 IMPLANT
GOWN STRL REUS W/ TWL LRG LVL3 (GOWN DISPOSABLE) ×1 IMPLANT
GOWN STRL REUS W/TWL LRG LVL3 (GOWN DISPOSABLE) ×1
IV LACTATED RINGER IRRG 3000ML (IV SOLUTION) ×8
IV LR IRRIG 3000ML ARTHROMATIC (IV SOLUTION) ×8 IMPLANT
KIT STABILIZATION SHOULDER (MISCELLANEOUS) ×1 IMPLANT
KIT SUTURE 2.8 Q-FIX DISP (MISCELLANEOUS) ×1 IMPLANT
KIT SUTURETAK 3.0 INSERT PERC (KITS) IMPLANT
KIT TURNOVER KIT A (KITS) ×1 IMPLANT
MANIFOLD NEPTUNE II (INSTRUMENTS) ×2 IMPLANT
MASK FACE SPIDER DISP (MASK) ×1 IMPLANT
MAT ABSORB  FLUID 56X50 GRAY (MISCELLANEOUS) ×2
MAT ABSORB FLUID 56X50 GRAY (MISCELLANEOUS) ×3 IMPLANT
NDL SAFETY ECLIP 18X1.5 (MISCELLANEOUS) ×1 IMPLANT
NEEDLE HYPO 22GX1.5 SAFETY (NEEDLE) ×1 IMPLANT
NS IRRIG 500ML POUR BTL (IV SOLUTION) ×1 IMPLANT
PACK ARTHROSCOPY SHOULDER (MISCELLANEOUS) ×1 IMPLANT
PAD ABD DERMACEA PRESS 5X9 (GAUZE/BANDAGES/DRESSINGS) ×1 IMPLANT
PAD ARMBOARD 7.5X6 YLW CONV (MISCELLANEOUS) ×2 IMPLANT
PAD WRAPON POLAR SHDR XLG (MISCELLANEOUS) ×1 IMPLANT
PASSER SUT FIRSTPASS SELF (INSTRUMENTS) ×1 IMPLANT
SHAVER BLADE BONE CUTTER 4.5 (BLADE) ×1 IMPLANT
SHAVER BLADE TAPERED BLUNT 4 (BLADE) ×1 IMPLANT
SLEEVE REMOTE CONTROL 5X12 (DRAPES) IMPLANT
SPONGE T-LAP 18X18 ~~LOC~~+RFID (SPONGE) ×1 IMPLANT
STRIP CLOSURE SKIN 1/2X4 (GAUZE/BANDAGES/DRESSINGS) ×1 IMPLANT
SUT ETHILON 4-0 (SUTURE) ×1
SUT ETHILON 4-0 FS2 18XMFL BLK (SUTURE) ×1
SUT LASSO 90 DEG SD STR (SUTURE) IMPLANT
SUT MNCRL 4-0 (SUTURE) ×1
SUT MNCRL 4-0 27XMFL (SUTURE) ×1
SUT PDS AB 0 CT1 27 (SUTURE) ×3 IMPLANT
SUT PERFECTPASSER WHITE CART (SUTURE) ×4 IMPLANT
SUT SMART STITCH CARTRIDGE (SUTURE) ×4 IMPLANT
SUT ULTRABRAID 2 COBRAID 38 (SUTURE) IMPLANT
SUT VIC AB 0 CT1 36 (SUTURE) ×3 IMPLANT
SUT VIC AB 2-0 CT2 27 (SUTURE) ×1 IMPLANT
SUTURE ETHLN 4-0 FS2 18XMF BLK (SUTURE) ×1 IMPLANT
SUTURE MNCRL 4-0 27XMF (SUTURE) ×1 IMPLANT
SYR 10ML LL (SYRINGE) ×1 IMPLANT
TAPE MICROFOAM 4IN (TAPE) ×1 IMPLANT
TRAP FLUID SMOKE EVACUATOR (MISCELLANEOUS) ×1 IMPLANT
TUBING CONNECTING 10 (TUBING) ×1 IMPLANT
TUBING INFLOW SET DBFLO PUMP (TUBING) ×1 IMPLANT
TUBING OUTFLOW SET DBLFO PUMP (TUBING) ×1 IMPLANT
WAND WEREWOLF FLOW 90D (MISCELLANEOUS) ×1 IMPLANT
WRAPON POLAR PAD SHDR XLG (MISCELLANEOUS) ×1

## 2022-08-31 NOTE — Discharge Instructions (Addendum)
AMBULATORY SURGERY  DISCHARGE INSTRUCTIONS   The drugs that you were given will stay in your system until tomorrow so for the next 24 hours you should not:  Drive an automobile Make any legal decisions Drink any alcoholic beverage   You may resume regular meals tomorrow.  Today it is better to start with liquids and gradually work up to solid foods.  You may eat anything you prefer, but it is better to start with liquids, then soup and crackers, and gradually work up to solid foods.   Please notify your doctor immediately if you have any unusual bleeding, trouble breathing, redness and pain at the surgery site, drainage, fever, or pain not relieved by medication.    Additional Instructions:  Taking tylenol 1000mg  every 8 hours alternating with ibuprofen 800mg  every 8 hours may reduce need for narcotic      Please contact your physician with any problems or Same Day Surgery at [phone], Monday through Friday 6 am to 4 pm, or [place] [facility] number at [phone].

## 2022-08-31 NOTE — H&P (Signed)
PREOPERATIVE H&P  Chief Complaint: Left Shoulder Rotator Cuff Tear  HPI: Joel Delacruz is a 56 y.o. male who presents for preoperative history and physical with a diagnosis of Left Shoulder Rotator Cuff Tear confirmed by MRI. Symptoms of pain, weakness and limitation of motion are significantly impairing his activities of daily living and his ability to perform at work.   Patient's failed nonoperative management and wished to proceed with surgical fixation of his left rotator cuff tear.  Past Medical History:  Diagnosis Date   Essential hypertension    High cholesterol    History of kidney stones    Hyperlipidemia    Umbilical hernia    Past Surgical History:  Procedure Laterality Date   COLONOSCOPY  70/62/3762   UMBILICAL HERNIA REPAIR     Social History   Socioeconomic History   Marital status: Married    Spouse name: Santiago Glad   Number of children: 2   Years of education: Not on file   Highest education level: Not on file  Occupational History   Not on file  Tobacco Use   Smoking status: Never   Smokeless tobacco: Never  Vaping Use   Vaping Use: Never used  Substance and Sexual Activity   Alcohol use: Yes    Comment: Occassionally   Drug use: No   Sexual activity: Not on file  Other Topics Concern   Not on file  Social History Narrative   Not on file   Social Determinants of Health   Financial Resource Strain: Not on file  Food Insecurity: Not on file  Transportation Needs: Not on file  Physical Activity: Not on file  Stress: Not on file  Social Connections: Not on file   History reviewed. No pertinent family history. No Known Allergies Prior to Admission medications   Medication Sig Start Date End Date Taking? Authorizing Provider  atorvastatin (LIPITOR) 20 MG tablet Take 20 mg by mouth at bedtime.   Yes [provider]  diltiazem (CARDIZEM CD) 120 MG 24 hr capsule Take 120 mg by mouth daily.   Yes [provider]  lisinopril (ZESTRIL)  10 MG tablet Take 10 mg by mouth daily.   Yes [provider]  EPINEPHrine 0.3 mg/0.3 mL IJ SOAJ injection Inject 0.3 mg into the muscle as needed for anaphylaxis.    [provider]  Melatonin 10 MG TABS Take 1 tablet by mouth at bedtime as needed.    [provider]     Positive ROS: All other systems have been reviewed and were otherwise negative with the exception of those mentioned in the HPI and as above.  Physical Exam: General: Alert, no acute distress Cardiovascular: Regular rate and rhythm, no murmurs rubs or gallops.  No pedal edema Respiratory: Clear to auscultation bilaterally, no wheezes rales or rhonchi. No cyanosis, no use of accessory musculature GI: No organomegaly, abdomen is soft and non-tender nondistended with positive bowel sounds. Skin: Skin intact, no lesions within the operative field. Neurologic: Sensation intact distally Psychiatric: Patient is competent for consent with normal mood and affect Lymphatic: No cervical lymphadenopathy  MUSCULOSKELETAL:  Left shoulder: Patient can forward elevate and abduct to approximately 150 degrees. He has pain in the mid range of abduction and full forward elevation. He had pain with impingement testing. He has pain with O'Brien's testing. There is no obvious weakness of the rotator cuff, but he has pain with a downward directed force on his abducted shoulder. He has full digital wrist and elbow  range of motion, intact sensation to light touch, and a palpable radial pulse.  Radiology: I have reviewed the MRI images as well as the radiology report.  Patient has a deep, near full thickness tear involving the supraspinatus extending from the bursal surface. The bursal fibers are lifted from the greater tuberosity. He has moderate bursitis. Patient has a type II SLAP tear as well. His long head of the biceps is tendinopathic.  Assessment: Left Shoulder Rotator Cuff Tear  Plan: Plan for Procedure(s): Left  arthroscopic subacromial decompression, distal clavicle excision, mini open rotator cuff repair possible biceps tenodesis  I reviewed the details of the operation as well as the postoperative course with the patient.  I informed him that we would fix any pathology we encountered during his scope including possible biceps tenotomy versus tenodesis for a type II SLAP tear seen on his MRI.Marland Kitchen  We discussed the risks of both possible Popeye deformity of the biceps if a tenotomy or tenodesis was performed.  I discussed the risks and benefits of surgery. The risks include but are not limited to infection, bleeding, nerve or blood vessel injury, joint stiffness or loss of motion, persistent pain, weakness or instability, retear of the rotator cuff, labrum or biceps tendon, Popeye deformity of the biceps muscle, failure of the repair,hardware failure and the need for further surgery. Patient understood these risks and wished to proceed.     Juanell Fairly, MD   08/31/2022 11:39 AM

## 2022-08-31 NOTE — Anesthesia Procedure Notes (Signed)
Procedure Name: Intubation Date/Time: 08/31/2022 11:54 AM  Performed by: Lorie Apley, CRNAPre-anesthesia Checklist: Patient identified, Patient being monitored, Timeout performed, Emergency Drugs available and Suction available Patient Re-evaluated:Patient Re-evaluated prior to induction Oxygen Delivery Method: Circle system utilized Preoxygenation: Pre-oxygenation with 100% oxygen Induction Type: IV induction Ventilation: Mask ventilation without difficulty Laryngoscope Size: Mac, 4 and McGraph Grade View: Grade I Tube type: Oral Tube size: 7.5 mm Number of attempts: 1 Airway Equipment and Method: Stylet Placement Confirmation: ETT inserted through vocal cords under direct vision, positive ETCO2 and breath sounds checked- equal and bilateral Secured at: 22 cm Tube secured with: Tape Dental Injury: Teeth and Oropharynx as per pre-operative assessment

## 2022-08-31 NOTE — Op Note (Addendum)
08/31/2022  3:44 PM  PATIENT:  Joel Delacruz  56 y.o. male  PRE-OPERATIVE DIAGNOSIS:  Left Shoulder Rotator Cuff Tear  POST-OPERATIVE DIAGNOSIS:  Left Shoulder Rotator Cuff Tear, SLAP lesion  PROCEDURE:   Left shoulder arthroscopic superior labral repair Left shoulder arthroscopic subacromial decompression and debridement Left shoulder arthroscopic distal clavicle excision Left shoulder mini open rotator cuff repair  SURGEON:  Surgeon(s) and Role:    Thornton Park, MD - Primary  ANESTHESIA:   general and paracervical block   PREOPERATIVE INDICATIONS:  Joel Delacruz is a  55 y.o. male with a diagnosis of Left Shoulder Rotator Cuff Tear failed conservative treatment and elected for surgical management.    The risks benefits and alternatives were discussed with the patient preoperatively including but not limited to the risks of infection, bleeding, nerve injury, persistent pain or weakness, shoulder stiffness/arthrofibrosis, failure of the repair, re-tear of the rotator cuff and the need for further surgery. Medical risks include DVT and pulmonary embolism, myocardial infarction, stroke, pneumonia, respiratory failure and death. Patient understood these risks and wished to proceed.  OPERATIVE IMPLANTS: Lee MultiFix anchor x 1 & Smith [employer] Nephew Q Fix anchor x 1.  Arthrex bio suture tack anchor x1 for superior labral repair.  OPERATIVE FINDINGS: Patient had a superior labral tear above in the biceps anchor.  This did not extend posteriorly.  Patient had a Buford complex.  There was no anterior or posterior labral tears.  Patient had no focal chondral lesions of the glenohumeral joint.  Patient had subacromial impingement and bursitis.  She had advanced acromioclavicular joint arthrosis.  Patient had a high-grade partial-thickness tear involving the supraspinatus.  OPERATIVE PROCEDURE: The patient was met in the preoperative area. The left shoulder was signed with the word  yes and my initials according the hospital's correct site of surgery protocol.   A pre-op history and physical was performed at the bedside.  Patient underwent an interscalene block with Exparel by the anesthesia service.  Patient was brought to the operating room where he underwent general anesthesia.  The patient was placed in a beachchair position.  A spider arm positioner was used for this case.  Examination under anesthesia revealed no loss of passive range of motion or instability with load shift testing. The patient had a negative sulcus sign.  Patient was prepped and draped in a sterile fashion. A timeout was performed to verify the patient's name, date of birth, medical record number, correct site of surgery and correct procedure to be performed there was also used to verify the patient received antibiotics that all appropriate instruments, implants and radiographs studies were available in the room. Once all in attendance were in agreement case began.  Patient received ancef 2 grams IV for pre-op antibiotics.  Bony landmarks were drawn out with a surgical marker along with proposed arthroscopy incisions.  An 11 blade was used to establish a posterior portal through which the arthroscope was placed in the glenohumeral joint.  An anterior portal was established under direct visualization using an 18-gauge spinal needle.  A 5.75 mm arthroscopic cannula was placed to the anterior portal.  A full diagnostic examination of the shoulder was performed.    The was found to have a superior labral tear with a Buford complex.  The superior labral tear involve the biceps anchor.  The decision was made to perform a superior labral repair.  The 5.75 mm arthroscopic cannula was replaced with a 7 mm cannula.  The superior glenoid was prepared with a 4.5 full-radius shaver to debride all torn fibers of the superior labrum until punctate bleeding was identified.  A single Arthrex bio suture tack anchor was placed into  the superior labrum just beneath the biceps tendon insertion.  A 90 degree suture lasso was used to pass a single limb of the bio suture tack anchor under the superior labrum.  An arthroscopic knot tying technique was then used to repair the superior labrum and stabilize the biceps anchor.  A probe was used to confirm that the biceps anchor was stabilized after repair.  The arthroscope was then placed in the subacromial space.  Extensive bursitis was encountered and debrided using a Dyonics tapered shaver blade and a Sargeant werewolf wand from a lateral portal which was established under direct visualization using an 18-gauge spinal needle. A subacromial decompression was performed using a 4.5 mm Dyonics bone cutter shaver blade from the lateral portal.   A distal clavicle excision was then performed through the anterior portal also using the 4.5 mm bone cutter shaver blade.    A high-grade partial-thickness tear involving the supraspinatus was identified from the bursal side.  Two perfect sutures were placed in the lateral edge of the rotator cuff tear.  A 4.5 mm bone cutter shaver blade was then used to debride the greater tuberosity of all torn fibers of the rotator cuff. All arthroscopic instruments were then removed and the mini-open portion of the procedure began.  A saber-type incision was made along the lateral border of the acromion. The deltoid muscle was identified and split in line with its fibers which allowed visualization of the rotator cuff. The Perfect Pass sutures previously placed in the lateral border of the rotator cuff were brought out through the deltoid split.  The rotator cuff tear was identified through the deltoid split.  The few remaining fibers of the rotator cuff attached to the medial greater tuberosity were released using a #15 blade. A single [employer] anchor was then placed at the articular margin of the humeral head with the greater tuberosity. The  suture limbs of the Q Fix anchors were passed medially through the rotator cuff using a First Pass suture passer and clamped with a hemostat for later fixation. The two Perfect Pass sutures in the lateral border of the rotator cuff were then anchored to the greater tuberosity footprint using a single [employer] Multifix anchor. The sutures passed through the Multifix anchor were then tensioned to allow reduction of the rotator cuff to the greater tuberosity footprint. The medial row repair was then performed using the Q fix sutures, which were tied down using an arthroscopic knot tying technique.  Arthroscopic images of the repair were taken with the arthroscope both externally and from inside the glenohumeral joint.  All incisions were copiously irrigated. The deltoid fascia was repaired using a 0 Vicryl suture.  The subcutaneous tissue of all incisions were closed with a 2-0 Vicryl. Skin closure for the arthroscopic incisions was performed with 4-0 nylon. The skin edges of the saber incision was approximated with a running 4-0 undyed Monocryl.  A dry sterile dressing was applied.  The patient was placed in an abduction sling and a Polar Care was applied to the shoulder.  All sharp, sponge and it instrument counts were correct at the conclusion of the case. I was scrubbed and present for the entire case. I spoke with the patient's family postoperatively to  let them know the case had been performed without complication and the patient was stable in recovery room.

## 2022-08-31 NOTE — Anesthesia Preprocedure Evaluation (Signed)
Anesthesia Evaluation  Patient identified by MRN, date of birth, ID band Patient awake    Reviewed: Allergy & Precautions, NPO status , Patient's Chart, lab work & pertinent test results  History of Anesthesia Complications Negative for: history of anesthetic complications  Airway Mallampati: IV  TM Distance: >3 FB Neck ROM: full    Dental  (+) Dental Advidsory Given, Teeth Intact   Pulmonary neg pulmonary ROS, neg COPD, neg recent URI,    Pulmonary exam normal        Cardiovascular hypertension, (-) angina(-) Past MI and (-) CABG negative cardio ROS Normal cardiovascular exam     Neuro/Psych negative neurological ROS  negative psych ROS   GI/Hepatic negative GI ROS, Neg liver ROS,   Endo/Other  negative endocrine ROS  Renal/GU      Musculoskeletal   Abdominal   Peds  Hematology negative hematology ROS (+)   Anesthesia Other Findings Past Medical History: No date: Essential hypertension No date: High cholesterol No date: History of kidney stones No date: Hyperlipidemia No date: Umbilical hernia  Past Surgical History: 08/31/2021: COLONOSCOPY No date: UMBILICAL HERNIA REPAIR  BMI    Body Mass Index: 34.21 kg/m      Reproductive/Obstetrics negative OB ROS                             Anesthesia Physical Anesthesia Plan  ASA: 2  Anesthesia Plan: General ETT   Post-op Pain Management: Regional block*   Induction: Intravenous  PONV Risk Score and Plan: Ondansetron, Dexamethasone and Midazolam  Airway Management Planned: Oral ETT  Additional Equipment:   Intra-op Plan:   Post-operative Plan: Extubation in OR  Informed Consent: I have reviewed the patients History and Physical, chart, labs and discussed the procedure including the risks, benefits and alternatives for the proposed anesthesia with the patient or authorized representative who has indicated his/her  understanding and acceptance.     Dental Advisory Given  Plan Discussed with: Anesthesiologist, CRNA and Surgeon  Anesthesia Plan Comments: (Patient consented for risks of anesthesia including but not limited to:  - adverse reactions to medications - damage to eyes, teeth, lips or other oral mucosa - nerve damage due to positioning  - sore throat or hoarseness - Damage to heart, brain, nerves, lungs, other parts of body or loss of life  Patient voiced understanding.)        Anesthesia Quick Evaluation

## 2022-08-31 NOTE — Anesthesia Procedure Notes (Signed)
Anesthesia Regional Block: Interscalene brachial plexus block   Pre-Anesthetic Checklist: , timeout performed,  Correct Patient, Correct Site, Correct Laterality,  Correct Procedure, Correct Position, site marked,  Risks and benefits discussed,  Surgical consent,  Pre-op evaluation,  At surgeon's request and post-op pain management  Laterality: Lower  Prep: chloraprep       Needles:  Injection technique: Single-shot  Needle Type: Echogenic Needle     Needle Length: 9cm  Needle Gauge: 21     Additional Needles:   Procedures:,,,, ultrasound used (permanent image in chart),,    Narrative:  Start time: 08/31/2022 10:10 AM End time: 08/31/2022 10:15 AM Injection made incrementally with aspirations every 5 mL.  Performed by: Personally  Anesthesiologist: Dimas Millin, MD  Additional Notes: Patient's chart reviewed and they were deemed appropriate candidate for procedure, per surgeon's request. Patient educated about risks, benefits, and alternatives of the block including but not limited to: temporary or permanent nerve damage, bleeding, infection, damage to surround tissues, block failure, local anesthetic toxicity. Patient expressed understanding. A formal time-out was conducted consistent with institution rules.  Monitors were applied, and minimal sedation used (see nursing record). The site was prepped with skin prep and allowed to dry, and sterile gloves were used. A high frequency linear ultrasound probe with probe cover was utilized throughout. Femoral artery visualized at mid-thigh level, local anesthetic injected anterolateral to it, and echogenic block needle trajectory was monitored throughout. Hydrodissection of saphenous nerve visualized and appeared anatomically normal. Aspiration performed every 74ml. Blood vessels were avoided. All injections were performed without resistance and free of blood and paresthesias. The patient tolerated the procedure well.  Injectate: 20cc  of exparel and 10cc of 0.5% bupivacaine

## 2022-08-31 NOTE — Transfer of Care (Signed)
Immediate Anesthesia Transfer of Care Note  Patient: Joel Delacruz  Procedure(s) Performed: SHOULDER ARTHROSCOPY WITH OPEN ROTATOR CUFF REPAIR AND DISTAL CLAVICLE ACROMINECTOMY (Left) SHOULDER ARTHROSCOPY WITH SUBACROMIAL DECOMPRESSION AND DISTAL CLAVICLE EXCISION AND SLAP LESION REPAIR (Left)  Patient Location: PACU  Anesthesia Type:General  Level of Consciousness: drowsy  Airway & Oxygen Therapy: Patient Spontanous Breathing and Patient connected to face mask oxygen  Post-op Assessment: Report given to RN and Post -op Vital signs reviewed and stable  Post vital signs: Reviewed and stable  Last Vitals:  Vitals Value Taken Time  BP 121/82 08/31/22 1510  Temp 36.9 C 08/31/22 1510  Pulse 83 08/31/22 1513  Resp 21 08/31/22 1513  SpO2 97 % 08/31/22 1513  Vitals shown include unvalidated device data.  Last Pain:  Vitals:   08/31/22 0926  TempSrc: Temporal  PainSc: 3          Complications: No notable events documented.

## 2022-09-01 ENCOUNTER — Encounter: Payer: Self-pay | Admitting: Orthopedic Surgery

## 2022-09-01 NOTE — Anesthesia Postprocedure Evaluation (Signed)
Anesthesia Post Note  Patient: Joel Delacruz  Procedure(s) Performed: SHOULDER ARTHROSCOPY WITH OPEN ROTATOR CUFF REPAIR AND DISTAL CLAVICLE ACROMINECTOMY (Left) SHOULDER ARTHROSCOPY WITH SUBACROMIAL DECOMPRESSION AND DISTAL CLAVICLE EXCISION AND SLAP LESION REPAIR (Left)  Patient location during evaluation: PACU Anesthesia Type: General Level of consciousness: awake and alert Pain management: pain level controlled Vital Signs Assessment: post-procedure vital signs reviewed and stable Respiratory status: spontaneous breathing, nonlabored ventilation, respiratory function stable and patient connected to nasal cannula oxygen Cardiovascular status: blood pressure returned to baseline and stable Postop Assessment: no apparent nausea or vomiting Anesthetic complications: no   No notable events documented.   Last Vitals:  Vitals:   08/31/22 1545 08/31/22 1554  BP: 116/72 138/84  Pulse: 84 82  Resp: 16 16  Temp: 37.1 C 36.5 C  SpO2: 93% 94%    Last Pain:  Vitals:   08/31/22 1554  TempSrc: Temporal  PainSc: 0-No pain                 Dimas Millin

## 2022-11-22 ENCOUNTER — Other Ambulatory Visit: Payer: Self-pay

## 2022-11-22 ENCOUNTER — Ambulatory Visit (INDEPENDENT_AMBULATORY_CARE_PROVIDER_SITE_OTHER): Payer: BC Managed Care – PPO

## 2022-11-22 ENCOUNTER — Encounter: Payer: Self-pay | Admitting: Emergency Medicine

## 2022-11-22 ENCOUNTER — Ambulatory Visit
Admission: EM | Admit: 2022-11-22 | Discharge: 2022-11-22 | Disposition: A | Discharge status: Home / Self Care | Payer: BC Managed Care – PPO

## 2022-11-22 DIAGNOSIS — M25572 Pain in left ankle and joints of left foot: Secondary | ICD-10-CM

## 2022-11-22 DIAGNOSIS — R600 Localized edema: Secondary | ICD-10-CM | POA: Diagnosis not present

## 2022-11-22 NOTE — ED Provider Notes (Signed)
Roderic Palau    CSN: [id number] Arrival date & time: 11/22/22  0901      History   Chief Complaint Chief Complaint  Patient presents with   Ankle Pain    HPI BARTON WANT is a 57 y.o. male.    Ankle Pain   Presents to urgent care with complaint of intermittent left ankle swelling x 1 week.  No known injury or precipitating event.  Patient is hypertensive in clinic and states he did not take his BP medications and has not taken his prescribed medications.  Past Medical History:  Diagnosis Date   Essential hypertension    High cholesterol    History of kidney stones    Hyperlipidemia    Umbilical hernia     There are no problems to display for this patient.   Past Surgical History:  Procedure Laterality Date   COLONOSCOPY  08/31/2021   SHOULDER ARTHROSCOPY WITH OPEN ROTATOR CUFF REPAIR AND DISTAL CLAVICLE ACROMINECTOMY Left 08/31/2022   Procedure: SHOULDER ARTHROSCOPY WITH OPEN ROTATOR CUFF REPAIR AND DISTAL CLAVICLE ACROMINECTOMY;  Surgeon: Thornton Park, MD;  Location: [facility] ORS;  Service: Orthopedics;  Laterality: Left;   UMBILICAL HERNIA REPAIR         Home Medications    Prior to Admission medications   Medication Sig Start Date End Date Taking? Authorizing Provider  indomethacin (INDOCIN) 50 MG capsule PLEASE SEE ATTACHED FOR DETAILED DIRECTIONS 03/26/22  Yes [provider]  atorvastatin (LIPITOR) 20 MG tablet Take 20 mg by mouth at bedtime.    [provider]  diltiazem (CARDIZEM CD) 120 MG 24 hr capsule Take 120 mg by mouth daily.    [provider]  docusate sodium (COLACE) 100 MG capsule Take 1 capsule (100 mg total) by mouth daily as needed. 08/31/22 08/31/23  Thornton Park, MD  EPINEPHrine 0.3 mg/0.3 mL IJ SOAJ injection Inject 0.3 mg into the muscle as needed for anaphylaxis.    [provider]  lisinopril (ZESTRIL) 10 MG tablet Take 10 mg by mouth daily.    [provider]  Melatonin 10  MG TABS Take 1 tablet by mouth at bedtime as needed.    [provider]  ondansetron (ZOFRAN) 4 MG tablet Take 1 tablet (4 mg total) by mouth every 8 (eight) hours as needed for nausea or vomiting. 08/31/22   Thornton Park, MD  oxyCODONE (OXY IR/ROXICODONE) 5 MG immediate release tablet Take 1 tablet (5 mg total) by mouth every 4 (four) hours as needed for severe pain. Patient not taking: Reported on 11/22/2022 08/31/22   Thornton Park, MD    Family History History reviewed. No pertinent family history.  Social History Social History   Tobacco Use   Smoking status: Never   Smokeless tobacco: Never  Vaping Use   Vaping Use: Never used  Substance Use Topics   Alcohol use: Yes    Comment: Occassionally   Drug use: No     Allergies   Patient has no known allergies.   Review of Systems Review of Systems   Physical Exam Triage Vital Signs ED Triage Vitals  Enc Vitals Group     BP 11/22/22 1021 (!) 167/109     Pulse Rate 11/22/22 1021 90     Resp 11/22/22 1021 16     Temp 11/22/22 1021 97.9 F (36.6 C)     Temp Source 11/22/22 1021 Temporal     SpO2 11/22/22 1021 98 %     Weight --  Height --      Head Circumference --      Peak Flow --      Pain Score 11/22/22 1025 7     Pain Loc --      Pain Edu? --      Excl. in [place]? --    No data found.  Updated Vital Signs BP (!) 167/109 (BP Location: Left Arm) Comment: Pt states he did not take his BP meds today  Pulse 90   Temp 97.9 F (36.6 C) (Temporal)   Resp 16   SpO2 98%   Visual Acuity Right Eye Distance:   Left Eye Distance:   Bilateral Distance:    Right Eye Near:   Left Eye Near:    Bilateral Near:     Physical Exam Vitals reviewed.  Constitutional:      Appearance: Normal appearance.  Musculoskeletal:     Left ankle: Swelling present. No deformity. Tenderness present over the medial malleolus. Normal range of motion.     Left Achilles Tendon: Normal.  Skin:    General: Skin is  warm and dry.  Neurological:     General: No focal deficit present.     Mental Status: He is alert and oriented to person, place, and time.  Psychiatric:        Mood and Affect: Mood normal.        Behavior: Behavior normal.      UC Treatments / Results  Labs (all labs ordered are listed, but only abnormal results are displayed) Labs Reviewed - No data to display  EKG   Radiology No results found.  Procedures Procedures (including critical care time)  Medications Ordered in UC Medications - No data to display  Initial Impression / Assessment and Plan / UC Course  I have reviewed the triage vital signs and the nursing notes.  Pertinent labs & imaging results that were available during my care of the patient were reviewed by me and considered in my medical decision making (see chart for details).   Suspect dependent edema of left ankle.  Ankle x-ray excludes fracture.  Wrapped joint in Ace bandage and recommended compression stockings.  Follow-up with orthopedic visit if symptoms do not improve.   Final Clinical Impressions(s) / UC Diagnoses   Final diagnoses:  None   Discharge Instructions   None    ED Prescriptions   None    PDMP not reviewed this encounter.   Rose Phi, [place] 11/22/22 1123

## 2022-11-22 NOTE — ED Triage Notes (Signed)
Intermittent left ankle swelling, no known injury.  Issues with swelling for one week

## 2022-11-22 NOTE — ED Triage Notes (Addendum)
Patient does not take blood pressure medicine that is prescribed

## 2022-11-22 NOTE — Discharge Instructions (Addendum)
Wear Ace bandage or compression stockings on your foot during the day.  Remove at night.  Recommend follow-up at orthopedic provider if your symptoms do not improve.

## 2023-11-28 ENCOUNTER — Other Ambulatory Visit: Payer: Self-pay | Admitting: Orthopedic Surgery

## 2023-11-28 DIAGNOSIS — M546 Pain in thoracic spine: Secondary | ICD-10-CM

## 2023-12-02 NOTE — Progress Notes (Signed)
 Patient for [facility] Thoracic Lumbar Spine Myelogram Inj/CT Thoracic Myelogram on Monday 12/05/2023, I called and spoke with the patient on the phone and gave pre-procedure instructions. Pt was made aware to be here at 8:30a and check in at the new entrance. Pt stated understanding.  Called 12/02/2023

## 2023-12-05 ENCOUNTER — Ambulatory Visit
Admission: RE | Admit: 2023-12-05 | Discharge: 2023-12-05 | Disposition: A | Discharge status: Home / Self Care | Payer: 59 | Source: Ambulatory Visit | Attending: Orthopedic Surgery

## 2023-12-05 DIAGNOSIS — M546 Pain in thoracic spine: Secondary | ICD-10-CM | POA: Insufficient documentation

## 2023-12-05 MED ORDER — IOHEXOL 300 MG/ML  SOLN
10.0000 mL | Freq: Once | INTRAMUSCULAR | Status: AC | PRN
  Administered 2023-12-05: 10 mL via INTRATHECAL

## 2023-12-05 MED ORDER — LIDOCAINE HCL 1 % IJ SOLN
5.0000 mL | Freq: Once | INTRAMUSCULAR | Status: AC
  Administered 2023-12-05: 5 mL via INTRADERMAL

## 2023-12-05 NOTE — Procedures (Signed)
 Successful image guided LP access at L3-L4 for thoracic myelogram No complications  Brayton El PA-C Interventional Radiology 12/05/2023 9:52 AM

## 2023-12-05 NOTE — Discharge Instructions (Signed)
 Myelogram, Care After Refer to this sheet in the next few weeks. These instructions provide you with information on caring for yourself after your procedure. Your health care provider may also give you more specific instructions. Your treatment has been planned according to current medical practices, but problems sometimes occur. Call your health care provider if you have any problems or questions after your procedure. What can I expect after the procedure? After your procedure, it is typical to have the following sensations: Mild discomfort or pain at the insertion site. Mild headache that is relieved with pain medicines.  Follow these instructions at home:  Avoid lifting anything heavier than 10 lb (4.5 kg) for at least 12 hours after the procedure. Drink enough fluids to keep your urine clear or pale yellow. Remain at 30 degree head elevation for the remainder of the day.  Contact a health care provider if: You have fever or chills. You have nausea or vomiting. You have a headache that lasts for more than 2 days. Get help right away if: You have any numbness or tingling in your legs. You are unable to control your bowel or bladder. You have bleeding or swelling in your back at the insertion site. You are dizzy or faint. This information is not intended to replace advice given to you by your health care provider. Make sure you discuss any questions you have with your health care provider. Document Released: 11/13/2013 Document Revised: 04/15/2016 Document Reviewed: 07/17/2013 Elsevier Interactive Patient Education  2017 [employer].

## 2024-05-26 ENCOUNTER — Emergency Department
Admission: EM | Admit: 2024-05-26 | Discharge: 2024-05-26 | Disposition: A | Discharge status: Home / Self Care | Attending: Emergency Medicine | Admitting: Emergency Medicine

## 2024-05-26 ENCOUNTER — Emergency Department

## 2024-05-26 ENCOUNTER — Other Ambulatory Visit: Payer: Self-pay

## 2024-05-26 DIAGNOSIS — R0602 Shortness of breath: Secondary | ICD-10-CM | POA: Diagnosis not present

## 2024-05-26 DIAGNOSIS — I509 Heart failure, unspecified: Secondary | ICD-10-CM | POA: Insufficient documentation

## 2024-05-26 DIAGNOSIS — R0789 Other chest pain: Secondary | ICD-10-CM | POA: Diagnosis not present

## 2024-05-26 DIAGNOSIS — I110 Hypertensive heart disease with heart failure: Secondary | ICD-10-CM | POA: Insufficient documentation

## 2024-05-26 DIAGNOSIS — R079 Chest pain, unspecified: Secondary | ICD-10-CM | POA: Diagnosis present

## 2024-05-26 LAB — CBC WITH DIFFERENTIAL/PLATELET
Abs Immature Granulocytes: 0.03 K/uL (ref 0.00–0.07)
Basophils Absolute: 0 K/uL (ref 0.0–0.1)
Basophils Relative: 0 %
Eosinophils Absolute: 0.1 K/uL (ref 0.0–0.5)
Eosinophils Relative: 2 %
HCT: 39.9 % (ref 39.0–52.0)
Hemoglobin: 13.1 g/dL (ref 13.0–17.0)
Immature Granulocytes: 0 %
Lymphocytes Relative: 23 %
Lymphs Abs: 1.5 K/uL (ref 0.7–4.0)
MCH: 29.2 pg (ref 26.0–34.0)
MCHC: 32.8 g/dL (ref 30.0–36.0)
MCV: 89.1 fL (ref 80.0–100.0)
Monocytes Absolute: 0.6 K/uL (ref 0.1–1.0)
Monocytes Relative: 9 %
Neutro Abs: 4.4 K/uL (ref 1.7–7.7)
Neutrophils Relative %: 66 %
Platelets: 230 K/uL (ref 150–400)
RBC: 4.48 MIL/uL (ref 4.22–5.81)
RDW: 13.2 % (ref 11.5–15.5)
WBC: 6.7 K/uL (ref 4.0–10.5)
nRBC: 0 % (ref 0.0–0.2)

## 2024-05-26 LAB — TROPONIN I (HIGH SENSITIVITY)
Troponin I (High Sensitivity): 5 ng/L (ref <18)
Troponin I (High Sensitivity): 4 ng/L (ref <18)

## 2024-05-26 LAB — COMPREHENSIVE METABOLIC PANEL WITH GFR
ALT: 32 U/L (ref 0–44)
AST: 24 U/L (ref 15–41)
Albumin: 3.8 g/dL (ref 3.5–5.0)
Alkaline Phosphatase: 64 U/L (ref 38–126)
Anion gap: 8 (ref 5–15)
BUN: 21 mg/dL — ABNORMAL HIGH (ref 6–20)
CO2: 24 mmol/L (ref 22–32)
Calcium: 8.9 mg/dL (ref 8.9–10.3)
Chloride: 108 mmol/L (ref 98–111)
Creatinine, Ser: 1.24 mg/dL (ref 0.61–1.24)
GFR, Estimated: >60 mL/min (ref >60)
Glucose, Bld: 93 mg/dL (ref 70–99)
Potassium: 4.7 mmol/L (ref 3.5–5.1)
Sodium: 140 mmol/L (ref 135–145)
Total Bilirubin: 0.4 mg/dL (ref 0.0–1.2)
Total Protein: 6.9 g/dL (ref 6.5–8.1)

## 2024-05-26 LAB — BRAIN NATRIURETIC PEPTIDE: B Natriuretic Peptide: 42.3 pg/mL (ref 0.0–100.0)

## 2024-05-26 NOTE — ED Triage Notes (Signed)
 States he had been having a chest pressure feeling mid to Left chest chest x 2 weeks. Also states he feels a little short of breath when talking and a dry cough.  Has a Halter monitor on since last Sunday. States he works outside

## 2024-05-26 NOTE — ED Provider Notes (Signed)
 [facility] Provider Note    Event Date/Time   First MD Initiated Contact with Patient 05/26/24 1247     (approximate)   History   Chest Pain   HPI Joel Delacruz is a 58 y.o. male with history of HTN, HLD, HFrEF presenting today for chest pain.  Patient states for the past 3 weeks he has had intermittent left-sided chest pain.  He states it does not really feel like a pain but more like a knot.  Symptoms come and go with no obvious aggravating factors.  He actually feels like they get better when he is up moving around or walking upstairs.  He states when he talks for long periods of time he gets short of breath.  Otherwise denies any new cough, congestion, abdominal pain, nausea, vomiting, leg pain, leg swelling.  Chart review: Reviewed last cardiology visit on 05/07/2024 for similar chest pain symptoms.  He has had negative cardiac CT in the past with most recent on 02/13/2024 with only 10 to 15% stenosis of the proximal LAD.  At that time, they did not think the symptoms were related to coronary artery disease.  He was placed with a Holter monitor at that time.     Physical Exam   Triage Vital Signs: ED Triage Vitals  Encounter Vitals Group     BP 05/26/24 1216 123/76     Girls Systolic BP Percentile --      Girls Diastolic BP Percentile --      Boys Systolic BP Percentile --      Boys Diastolic BP Percentile --      Pulse Rate 05/26/24 1216 99     Resp 05/26/24 1216 18     Temp 05/26/24 1223 98.8 F (37.1 C)     Temp Source 05/26/24 1223 Oral     SpO2 05/26/24 1216 99 %     Weight --      Height --      Head Circumference --      Peak Flow --      Pain Score 05/26/24 1216 5     Pain Loc --      Pain Education --      Exclude from Growth Chart --     Most recent vital signs: Vitals:   05/26/24 1216 05/26/24 1223  BP: 123/76   Pulse: 99   Resp: 18   Temp:  98.8 F (37.1 C)  SpO2: 99%    Physical Exam: I have reviewed the vital signs  and nursing notes. General: Awake, alert, no acute distress.  Nontoxic appearing. Head:  Atraumatic, normocephalic.   ENT:  EOM intact, PERRL. Oral mucosa is pink and moist with no lesions. Neck: Neck is supple with full range of motion, No meningeal signs. Cardiovascular:  RRR, No murmurs. Peripheral pulses palpable and equal bilaterally. Respiratory:  Symmetrical chest wall expansion.  No rhonchi, rales, or wheezes.  Good air movement throughout.  No use of accessory muscles.   Musculoskeletal:  No cyanosis or edema. Moving extremities with full ROM Abdomen:  Soft, nontender, nondistended. Neuro:  GCS 15, moving all four extremities, interacting appropriately. Speech clear. Psych:  Calm, appropriate.   Skin:  Warm, dry, no rash.    ED Results / Procedures / Treatments   Labs (all labs ordered are listed, but only abnormal results are displayed) Labs Reviewed  COMPREHENSIVE METABOLIC PANEL WITH GFR - Abnormal; Notable for the following components:      Result  Value   BUN 21 (*)    All other components within normal limits  CBC WITH DIFFERENTIAL/PLATELET  BRAIN NATRIURETIC PEPTIDE  TROPONIN I (HIGH SENSITIVITY)  TROPONIN I (HIGH SENSITIVITY)     EKG My EKG interpretation: Rate of 62, normal sinus rhythm, normal axis, normal intervals.  No acute ST elevations or depressions   RADIOLOGY Independently interpreted chest x-ray with no acute findings   PROCEDURES:  Critical Care performed: No  Procedures   MEDICATIONS ORDERED IN ED: Medications - No data to display   IMPRESSION / MDM / ASSESSMENT AND PLAN / ED COURSE  I reviewed the triage vital signs and the nursing notes.                              Differential diagnosis includes, but is not limited to, atypical chest pain, ACS, pneumonia, pneumothorax, musculoskeletal pain, volume overload, CHF related chest pain  Patient's presentation is most consistent with acute complicated illness / injury requiring  diagnostic workup.  Patient is a 58 year old male presenting today for 3 weeks of a knot sensation on the left side of his chest.  Unable to fully describe well specific chest pain symptoms.  No other associated symptoms when they occur.  Vital signs are stable and EKG with no signs of ischemia.  Initial CBC, CMP, and troponin all negative.  Will collect a BNP as well as second troponin.  Chest x-ray with no acute findings.  BMP and second troponin both normal as well.  Patient reassessed with no significant ongoing symptoms.  Chest pain symptoms seem very atypical for CAD especially with recent cardiac CT workup negative along with improving echo.  Low concern for ACS at this time.  Patient did mention concern for anxiety which could be contributing to this.  I feel he is otherwise stable for discharge we will have him follow-up with PCP and cardiology as needed.     FINAL CLINICAL IMPRESSION(S) / ED DIAGNOSES   Final diagnoses:  Atypical chest pain     Rx / DC Orders   ED Discharge Orders     None        Note:  This document was prepared using Dragon voice recognition software and may include unintentional dictation errors.   Malvina Alm DASEN, MD 05/26/24 [phone]

## 2024-05-26 NOTE — Discharge Instructions (Signed)
 No evidence of heart attack today or signs of pneumonia.  Please follow-up with your cardiology team and primary care providers as needed.

## 2024-08-29 ENCOUNTER — Emergency Department

## 2024-08-29 ENCOUNTER — Other Ambulatory Visit: Payer: Self-pay

## 2024-08-29 ENCOUNTER — Emergency Department
Admission: EM | Admit: 2024-08-29 | Discharge: 2024-08-29 | Disposition: A | Discharge status: Home / Self Care | Attending: Emergency Medicine | Admitting: Emergency Medicine

## 2024-08-29 DIAGNOSIS — K2971 Gastritis, unspecified, with bleeding: Secondary | ICD-10-CM | POA: Insufficient documentation

## 2024-08-29 DIAGNOSIS — R109 Unspecified abdominal pain: Secondary | ICD-10-CM | POA: Diagnosis present

## 2024-08-29 LAB — CBC
HCT: 44.1 % (ref 39.0–52.0)
Hemoglobin: 14.9 g/dL (ref 13.0–17.0)
MCH: 29 pg (ref 26.0–34.0)
MCHC: 33.8 g/dL (ref 30.0–36.0)
MCV: 86 fL (ref 80.0–100.0)
Platelets: 253 K/uL (ref 150–400)
RBC: 5.13 MIL/uL (ref 4.22–5.81)
RDW: 12.9 % (ref 11.5–15.5)
WBC: 10 K/uL (ref 4.0–10.5)
nRBC: 0 % (ref 0.0–0.2)

## 2024-08-29 LAB — LIPASE, BLOOD: Lipase: 72 U/L — ABNORMAL HIGH (ref 11–51)

## 2024-08-29 LAB — COMPREHENSIVE METABOLIC PANEL WITH GFR
ALT: 34 U/L (ref 0–44)
AST: 22 U/L (ref 15–41)
Albumin: 4.1 g/dL (ref 3.5–5.0)
Alkaline Phosphatase: 65 U/L (ref 38–126)
Anion gap: 12 (ref 5–15)
BUN: 22 mg/dL — ABNORMAL HIGH (ref 6–20)
CO2: 21 mmol/L — ABNORMAL LOW (ref 22–32)
Calcium: 9.2 mg/dL (ref 8.9–10.3)
Chloride: 105 mmol/L (ref 98–111)
Creatinine, Ser: 1.43 mg/dL — ABNORMAL HIGH (ref 0.61–1.24)
GFR, Estimated: 58 mL/min — ABNORMAL LOW (ref >60)
Glucose, Bld: 97 mg/dL (ref 70–99)
Potassium: 4.2 mmol/L (ref 3.5–5.1)
Sodium: 138 mmol/L (ref 135–145)
Total Bilirubin: 0.5 mg/dL (ref 0.0–1.2)
Total Protein: 7.6 g/dL (ref 6.5–8.1)

## 2024-08-29 LAB — URINALYSIS, ROUTINE W REFLEX MICROSCOPIC
Bacteria, UA: NONE SEEN
Bilirubin Urine: NEGATIVE
Glucose, UA: >=500 mg/dL — AB
Hgb urine dipstick: NEGATIVE
Ketones, ur: NEGATIVE mg/dL
Leukocytes,Ua: NEGATIVE
Nitrite: NEGATIVE
Protein, ur: NEGATIVE mg/dL
RBC / HPF: 0 RBC/hpf (ref 0–5)
Specific Gravity, Urine: 1.009 (ref 1.005–1.030)
Squamous Epithelial / HPF: 0 /HPF (ref 0–5)
pH: 5 (ref 5.0–8.0)

## 2024-08-29 MED ORDER — IOHEXOL 300 MG/ML  SOLN
100.0000 mL | Freq: Once | INTRAMUSCULAR | Status: AC | PRN
  Administered 2024-08-29: 100 mL via INTRAVENOUS

## 2024-08-29 MED ORDER — SUCRALFATE 1 G PO TABS: 1.0000 g | ORAL_TABLET | Freq: Four times a day (QID) | ORAL | 1 refills | Status: AC

## 2024-08-29 MED ORDER — PANTOPRAZOLE SODIUM 40 MG PO TBEC: 40.0000 mg | DELAYED_RELEASE_TABLET | Freq: Every day | ORAL | 1 refills | Status: AC

## 2024-08-29 NOTE — ED Triage Notes (Signed)
 Pt reports abd pain for the past few weeks, pt reports tonight he noticed his stool to be dark in color. Pt reports he takes eliquis.

## 2024-08-29 NOTE — ED Provider Notes (Signed)
 [facility] Provider Note    Event Date/Time   First MD Initiated Contact with Patient 08/29/24 [phone]     (approximate)   History   Abdominal Pain   HPI  Joel Delacruz is a 58 y.o. male who presents to the emergency department today because concerns for abdominal pain.  The patient states that has been present for a few weeks.  As discussed with his primary care who is set up some further testing.  However came in tonight because he did noticed that his stool was black for the first time today.  The pain is located in the epigastric and periumbilical area.  He has a hard time describing the nature of the pain.  He has not had any significant nausea.  Denies any fevers.  Denies any recent medication or dietary changes.     Physical Exam   Triage Vital Signs: ED Triage Vitals  Encounter Vitals Group     BP 08/29/24 0100 125/79     Girls Systolic BP Percentile --      Girls Diastolic BP Percentile --      Boys Systolic BP Percentile --      Boys Diastolic BP Percentile --      Pulse Rate 08/29/24 0100 82     Resp 08/29/24 0101 18     Temp 08/29/24 0100 97.9 F (36.6 C)     Temp src --      SpO2 08/29/24 0101 95 %     Weight 08/29/24 0101 218 lb (98.9 kg)     Height 08/29/24 0101 5' 8 (1.727 m)     Head Circumference --      Peak Flow --      Pain Score 08/29/24 0100 6     Pain Loc --      Pain Education --      Exclude from Growth Chart --     Most recent vital signs: Vitals:   08/29/24 0101 08/29/24 0238  BP:  117/75  Pulse:  75  Resp: 18 18  Temp:    SpO2: 95% 100%   General: Awake, alert, oriented. CV:  Good peripheral perfusion. Regular rate and rhythm. Resp:  Normal effort. Lungs clear. Abd:  No distention. Non tender.   ED Results / Procedures / Treatments   Labs (all labs ordered are listed, but only abnormal results are displayed) Labs Reviewed  COMPREHENSIVE METABOLIC PANEL WITH GFR - Abnormal; Notable for the  following components:      Result Value   CO2 21 (*)    BUN 22 (*)    Creatinine, Ser 1.43 (*)    GFR, Estimated 58 (*)    All other components within normal limits  URINALYSIS, ROUTINE W REFLEX MICROSCOPIC - Abnormal; Notable for the following components:   Color, Urine STRAW (*)    APPearance CLEAR (*)    Glucose, UA >=500 (*)    All other components within normal limits  CBC  LIPASE, BLOOD     EKG  None   RADIOLOGY I independently interpreted and visualized the CT abd/pel. My interpretation: No free air Radiology interpretation:  IMPRESSION:  No acute CT findings of the abdomen or pelvis to explain abdominal  pain or dark stool.     PROCEDURES:  Critical Care performed: Yes  CRITICAL CARE Performed by: Guadalupe Eagles   Total critical care time: *** minutes  Critical care time was exclusive of separately billable procedures and treating other patients.  Critical care was necessary to treat or prevent imminent or life-threatening deterioration.  Critical care was time spent personally by me on the following activities: development of treatment plan with patient and/or surrogate as well as nursing, discussions with consultants, evaluation of patient's response to treatment, examination of patient, obtaining history from patient or surrogate, ordering and performing treatments and interventions, ordering and review of laboratory studies, ordering and review of radiographic studies, pulse oximetry and re-evaluation of patient's condition.   Procedures    MEDICATIONS ORDERED IN ED: Medications - No data to display   IMPRESSION / MDM / ASSESSMENT AND PLAN / ED COURSE  I reviewed the triage vital signs and the nursing notes.                              Differential diagnosis includes, but is not limited to, ***  Patient's presentation is most consistent with {EM COPA:27473}   ***The patient is on the cardiac monitor to evaluate for evidence of arrhythmia  and/or significant heart rate changes.  ***      FINAL CLINICAL IMPRESSION(S) / ED DIAGNOSES   Final diagnoses:  None        Rx / DC Orders   ED Discharge Orders     None        Note:  This document was prepared using Dragon voice recognition software and may include unintentional dictation errors.
